# Patient Record
Sex: Male | Born: 1950 | Race: White | Hispanic: No | Marital: Married | Smoking: Never smoker
Health system: Southern US, Community
[De-identification: ages and names within clinical notes are randomized; demographics above are authoritative.]

## PROBLEM LIST (undated history)

## (undated) DIAGNOSIS — I2510 Atherosclerotic heart disease of native coronary artery without angina pectoris: Secondary | ICD-10-CM

## (undated) DIAGNOSIS — E785 Hyperlipidemia, unspecified: Secondary | ICD-10-CM

## (undated) DIAGNOSIS — I214 Non-ST elevation (NSTEMI) myocardial infarction: Secondary | ICD-10-CM

## (undated) DIAGNOSIS — E7800 Pure hypercholesterolemia, unspecified: Secondary | ICD-10-CM

## (undated) DIAGNOSIS — K5790 Diverticulosis of intestine, part unspecified, without perforation or abscess without bleeding: Secondary | ICD-10-CM

## (undated) DIAGNOSIS — R001 Bradycardia, unspecified: Secondary | ICD-10-CM

## (undated) DIAGNOSIS — I10 Essential (primary) hypertension: Secondary | ICD-10-CM

## (undated) DIAGNOSIS — Z9861 Coronary angioplasty status: Secondary | ICD-10-CM

## (undated) DIAGNOSIS — M1990 Unspecified osteoarthritis, unspecified site: Secondary | ICD-10-CM

## (undated) DIAGNOSIS — T50905A Adverse effect of unspecified drugs, medicaments and biological substances, initial encounter: Secondary | ICD-10-CM

## (undated) DIAGNOSIS — I209 Angina pectoris, unspecified: Secondary | ICD-10-CM

## (undated) HISTORY — PX: MELANOMA EXCISION: SHX5266

## (undated) HISTORY — DX: Diverticulosis of intestine, part unspecified, without perforation or abscess without bleeding: K57.90

## (undated) SURGERY — LEFT HEART CATH
Anesthesia: Moderate Sedation | Laterality: Left

---
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
## 1972-11-27 HISTORY — PX: WISDOM TOOTH EXTRACTION: SHX21

## 2004-09-23 ENCOUNTER — Encounter: Admission: RE | Admit: 2004-09-23 | Discharge: 2004-09-23 | Payer: Self-pay | Admitting: Internal Medicine

## 2005-05-05 ENCOUNTER — Ambulatory Visit: Payer: Self-pay | Admitting: Gastroenterology

## 2005-05-19 ENCOUNTER — Ambulatory Visit: Payer: Self-pay | Admitting: Internal Medicine

## 2011-11-30 ENCOUNTER — Emergency Department (HOSPITAL_COMMUNITY): Payer: Managed Care, Other (non HMO)

## 2011-11-30 ENCOUNTER — Encounter (HOSPITAL_COMMUNITY)
Admission: EM | Disposition: A | Discharge status: Home / Self Care | Payer: Self-pay | Source: Home / Self Care | Attending: Emergency Medicine

## 2011-11-30 ENCOUNTER — Other Ambulatory Visit: Payer: Self-pay

## 2011-11-30 ENCOUNTER — Encounter: Payer: Self-pay | Admitting: Emergency Medicine

## 2011-11-30 ENCOUNTER — Observation Stay (HOSPITAL_COMMUNITY)
Admission: EM | Admit: 2011-11-30 | Discharge: 2011-12-01 | DRG: 251 | Disposition: A | Discharge status: Home / Self Care | Payer: Managed Care, Other (non HMO) | Attending: Internal Medicine | Admitting: Internal Medicine

## 2011-11-30 DIAGNOSIS — I9589 Other hypotension: Secondary | ICD-10-CM | POA: Diagnosis not present

## 2011-11-30 DIAGNOSIS — T50905A Adverse effect of unspecified drugs, medicaments and biological substances, initial encounter: Secondary | ICD-10-CM

## 2011-11-30 DIAGNOSIS — I498 Other specified cardiac arrhythmias: Secondary | ICD-10-CM | POA: Diagnosis not present

## 2011-11-30 DIAGNOSIS — Y9210 Unspecified residential institution as the place of occurrence of the external cause: Secondary | ICD-10-CM | POA: Diagnosis not present

## 2011-11-30 DIAGNOSIS — Z7982 Long term (current) use of aspirin: Secondary | ICD-10-CM

## 2011-11-30 DIAGNOSIS — E785 Hyperlipidemia, unspecified: Secondary | ICD-10-CM

## 2011-11-30 DIAGNOSIS — I200 Unstable angina: Secondary | ICD-10-CM | POA: Diagnosis present

## 2011-11-30 DIAGNOSIS — I10 Essential (primary) hypertension: Secondary | ICD-10-CM

## 2011-11-30 DIAGNOSIS — Z7902 Long term (current) use of antithrombotics/antiplatelets: Secondary | ICD-10-CM

## 2011-11-30 DIAGNOSIS — Z79899 Other long term (current) drug therapy: Secondary | ICD-10-CM

## 2011-11-30 DIAGNOSIS — T50995A Adverse effect of other drugs, medicaments and biological substances, initial encounter: Secondary | ICD-10-CM | POA: Diagnosis not present

## 2011-11-30 DIAGNOSIS — I2510 Atherosclerotic heart disease of native coronary artery without angina pectoris: Secondary | ICD-10-CM | POA: Diagnosis present

## 2011-11-30 DIAGNOSIS — E7800 Pure hypercholesterolemia, unspecified: Secondary | ICD-10-CM | POA: Diagnosis present

## 2011-11-30 DIAGNOSIS — Z9861 Coronary angioplasty status: Secondary | ICD-10-CM

## 2011-11-30 DIAGNOSIS — I214 Non-ST elevation (NSTEMI) myocardial infarction: Principal | ICD-10-CM

## 2011-11-30 DIAGNOSIS — R001 Bradycardia, unspecified: Secondary | ICD-10-CM

## 2011-11-30 HISTORY — DX: Essential (primary) hypertension: I10

## 2011-11-30 HISTORY — DX: Bradycardia, unspecified: R00.1

## 2011-11-30 HISTORY — DX: Coronary angioplasty status: Z98.61

## 2011-11-30 HISTORY — PX: LEFT HEART CATHETERIZATION WITH CORONARY ANGIOGRAM: SHX5451

## 2011-11-30 HISTORY — DX: Non-ST elevation (NSTEMI) myocardial infarction: I21.4

## 2011-11-30 HISTORY — DX: Angina pectoris, unspecified: I20.9

## 2011-11-30 HISTORY — DX: Hyperlipidemia, unspecified: E78.5

## 2011-11-30 HISTORY — PX: CARDIAC CATHETERIZATION: SHX172

## 2011-11-30 HISTORY — PX: PERCUTANEOUS CORONARY INTERVENTION-BALLOON ONLY: SHX6014

## 2011-11-30 HISTORY — DX: Unspecified osteoarthritis, unspecified site: M19.90

## 2011-11-30 HISTORY — DX: Pure hypercholesterolemia, unspecified: E78.00

## 2011-11-30 HISTORY — DX: Adverse effect of unspecified drugs, medicaments and biological substances, initial encounter: T50.905A

## 2011-11-30 HISTORY — DX: Atherosclerotic heart disease of native coronary artery without angina pectoris: I25.10

## 2011-11-30 LAB — BASIC METABOLIC PANEL
BUN: 18 mg/dL (ref 6–23)
CO2: 29 mEq/L (ref 19–32)
Calcium: 9.6 mg/dL (ref 8.4–10.5)
Chloride: 102 mEq/L (ref 96–112)
Creatinine, Ser: 1.19 mg/dL (ref 0.50–1.35)
GFR calc Af Amer: 76 mL/min — ABNORMAL LOW (ref >90)
GFR calc non Af Amer: 65 mL/min — ABNORMAL LOW (ref >90)
Glucose, Bld: 94 mg/dL (ref 70–99)
Potassium: 5 mEq/L (ref 3.5–5.1)
Sodium: 138 mEq/L (ref 135–145)

## 2011-11-30 LAB — CBC
HCT: 41.3 % (ref 39.0–52.0)
Hemoglobin: 14.2 g/dL (ref 13.0–17.0)
MCH: 32.1 pg (ref 26.0–34.0)
MCHC: 34.4 g/dL (ref 30.0–36.0)
MCV: 93.2 fL (ref 78.0–100.0)
Platelets: 205 10*3/uL (ref 150–400)
RBC: 4.43 MIL/uL (ref 4.22–5.81)
RDW: 12.1 % (ref 11.5–15.5)
WBC: 5.6 10*3/uL (ref 4.0–10.5)

## 2011-11-30 LAB — CARDIAC PANEL(CRET KIN+CKTOT+MB+TROPI)
CK, MB: 3.5 ng/mL (ref 0.3–4.0)
Relative Index: 3.1 — ABNORMAL HIGH (ref 0.0–2.5)
Total CK: 113 U/L (ref 7–232)
Troponin I: 0.34 ng/mL (ref <0.30)

## 2011-11-30 LAB — D-DIMER, QUANTITATIVE: D-Dimer, Quant: 0.28 ug/mL-FEU (ref 0.00–0.48)

## 2011-11-30 LAB — POCT I-STAT TROPONIN I: Troponin i, poc: 0.01 ng/mL (ref 0.00–0.08)

## 2011-11-30 SURGERY — LEFT HEART CATHETERIZATION WITH CORONARY ANGIOGRAM
Anesthesia: LOCAL

## 2011-11-30 SURGERY — LEFT HEART CATHETERIZATION WITH CORONARY ANGIOGRAM
Anesthesia: Moderate Sedation | Laterality: Right

## 2011-11-30 MED ORDER — LIDOCAINE HCL (PF) 1 % IJ SOLN
INTRAMUSCULAR | Status: AC
  Filled 2011-11-30: qty 30

## 2011-11-30 MED ORDER — NITROGLYCERIN 0.4 MG SL SUBL: 0.4000 mg | SUBLINGUAL_TABLET | SUBLINGUAL | Status: DC | PRN

## 2011-11-30 MED ORDER — NITROGLYCERIN 0.4 MG SL SUBL
0.4000 mg | SUBLINGUAL_TABLET | SUBLINGUAL | Status: DC | PRN
  Administered 2011-11-30: 0.4 mg via SUBLINGUAL

## 2011-11-30 MED ORDER — ASPIRIN 81 MG PO CHEW
81.0000 mg | CHEWABLE_TABLET | Freq: Every day | ORAL | Status: DC
  Administered 2011-12-01: 81 mg via ORAL
  Filled 2011-11-30: qty 1

## 2011-11-30 MED ORDER — SODIUM CHLORIDE 0.9 % IJ SOLN: 3.0000 mL | INTRAMUSCULAR | Status: DC | PRN

## 2011-11-30 MED ORDER — LISINOPRIL 20 MG PO TABS
20.0000 mg | ORAL_TABLET | Freq: Every day | ORAL | Status: DC
  Administered 2011-11-30 – 2011-12-01 (×2): 20 mg via ORAL
  Filled 2011-11-30 (×2): qty 1

## 2011-11-30 MED ORDER — THERA M PLUS PO TABS
1.0000 | ORAL_TABLET | Freq: Every day | ORAL | Status: DC
  Administered 2011-11-30 – 2011-12-01 (×2): 1 via ORAL
  Filled 2011-11-30 (×2): qty 1

## 2011-11-30 MED ORDER — ACETAMINOPHEN 325 MG PO TABS
650.0000 mg | ORAL_TABLET | ORAL | Status: DC | PRN
  Administered 2011-11-30: 650 mg via ORAL
  Filled 2011-11-30: qty 2

## 2011-11-30 MED ORDER — SODIUM CHLORIDE 0.9 % IV SOLN
INTRAVENOUS | Status: DC
  Administered 2011-11-30: 08:00:00 via INTRAVENOUS

## 2011-11-30 MED ORDER — ONDANSETRON HCL 4 MG/2ML IJ SOLN: 4.0000 mg | Freq: Four times a day (QID) | INTRAMUSCULAR | Status: DC | PRN

## 2011-11-30 MED ORDER — TICAGRELOR 90 MG PO TABS
90.0000 mg | ORAL_TABLET | Freq: Two times a day (BID) | ORAL | Status: DC
  Administered 2011-12-01: 90 mg via ORAL
  Filled 2011-11-30 (×2): qty 1

## 2011-11-30 MED ORDER — ASPIRIN EC 81 MG PO TBEC: 81.0000 mg | DELAYED_RELEASE_TABLET | Freq: Every day | ORAL | Status: DC

## 2011-11-30 MED ORDER — SODIUM CHLORIDE 0.9 % IV SOLN: 250.0000 mL | INTRAVENOUS | Status: DC | PRN

## 2011-11-30 MED ORDER — VERAPAMIL HCL 2.5 MG/ML IV SOLN
INTRAVENOUS | Status: AC
  Filled 2011-11-30: qty 2

## 2011-11-30 MED ORDER — ASPIRIN 300 MG RE SUPP
300.0000 mg | RECTAL | Status: DC
  Filled 2011-11-30: qty 1

## 2011-11-30 MED ORDER — ZOLPIDEM TARTRATE 5 MG PO TABS: 10.0000 mg | ORAL_TABLET | Freq: Every evening | ORAL | Status: DC | PRN

## 2011-11-30 MED ORDER — FENTANYL CITRATE 0.05 MG/ML IJ SOLN
INTRAMUSCULAR | Status: AC
  Filled 2011-11-30: qty 2

## 2011-11-30 MED ORDER — ASPIRIN 81 MG PO CHEW
324.0000 mg | CHEWABLE_TABLET | Freq: Once | ORAL | Status: AC
  Administered 2011-11-30: 324 mg via ORAL

## 2011-11-30 MED ORDER — RED YEAST RICE 600 MG PO CAPS: ORAL_CAPSULE | Freq: Every day | ORAL | Status: DC

## 2011-11-30 MED ORDER — TICAGRELOR 90 MG PO TABS
ORAL_TABLET | ORAL | Status: AC
  Administered 2011-12-01: 90 mg via ORAL
  Filled 2011-11-30: qty 2

## 2011-11-30 MED ORDER — SODIUM CHLORIDE 0.9 % IV SOLN
INTRAVENOUS | Status: DC
Start: 2011-11-30 — End: 2011-11-30

## 2011-11-30 MED ORDER — SODIUM CHLORIDE 0.9 % IV SOLN: INTRAVENOUS | Status: AC

## 2011-11-30 MED ORDER — HEPARIN (PORCINE) IN NACL 2-0.9 UNIT/ML-% IJ SOLN
INTRAMUSCULAR | Status: AC
  Filled 2011-11-30: qty 2000

## 2011-11-30 MED ORDER — ACETAMINOPHEN 325 MG PO TABS: 650.0000 mg | ORAL_TABLET | ORAL | Status: DC | PRN

## 2011-11-30 MED ORDER — HEPARIN SODIUM (PORCINE) 1000 UNIT/ML IJ SOLN
INTRAMUSCULAR | Status: AC
  Filled 2011-11-30: qty 1

## 2011-11-30 MED ORDER — ASPIRIN 81 MG PO CHEW
CHEWABLE_TABLET | ORAL | Status: AC
  Administered 2011-11-30: 324 mg via ORAL
  Filled 2011-11-30: qty 4

## 2011-11-30 MED ORDER — SODIUM CHLORIDE 0.9 % IJ SOLN
3.0000 mL | Freq: Two times a day (BID) | INTRAMUSCULAR | Status: DC
  Administered 2011-11-30 (×2): 3 mL via INTRAVENOUS

## 2011-11-30 MED ORDER — ASPIRIN 81 MG PO CHEW
324.0000 mg | CHEWABLE_TABLET | ORAL | Status: DC
  Administered 2011-11-30: 324 mg via ORAL

## 2011-11-30 MED ORDER — NITROGLYCERIN 0.4 MG SL SUBL
SUBLINGUAL_TABLET | SUBLINGUAL | Status: AC
  Administered 2011-11-30: 05:00:00
  Filled 2011-11-30: qty 25

## 2011-11-30 MED ORDER — BIVALIRUDIN 250 MG IV SOLR
INTRAVENOUS | Status: AC
  Filled 2011-11-30: qty 250

## 2011-11-30 MED ORDER — CO Q 10 10 MG PO CAPS: ORAL_CAPSULE | Freq: Every day | ORAL | Status: DC

## 2011-11-30 MED ORDER — MIDAZOLAM HCL 2 MG/2ML IJ SOLN
INTRAMUSCULAR | Status: AC
  Filled 2011-11-30: qty 2

## 2011-11-30 MED ORDER — MORPHINE SULFATE 2 MG/ML IJ SOLN: 2.0000 mg | INTRAMUSCULAR | Status: DC | PRN

## 2011-11-30 NOTE — Procedures (Signed)
  CARDIAC CATHETERIZATION REPORT   Procedures performed:  1. Left heart catheterization  2. Selective coronary angiography    Reason for procedure: Unstable angina pectoris  Procedure performed by: Thurmon Fair, MD, [facility]  Complications: none   Estimated blood loss: less than 5 mL   History:  Presentation with prolonged (new-onset) angina at rest responsive to NTG, no ECG changes and negative point of care cardiac enzymes.  Consent: The risks, benefits, and details of the procedure were explained to the patient. Risks including death, MI, stroke, bleeding, limb ischemia, renal failure and allergy were described and accepted by the patient. Informed written consent was obtained prior to proceeding.  Technique: The patient was brought to the cardiac catheterization laboratory in the fasting state. He was prepped and draped in the usual sterile fashion. Local anesthesia with 1% lidocaine was administered to the right wrist area. Using the modified Seldinger technique a 5 French right radial artery Glide sheath was introduced without difficulty. Under fluoroscopic guidance, using 5 [place] TIG and JR catheters, selective cannulation of the left coronary artery, right coronary artery and left ventricle were respectively performed. Several coronary angiograms in a variety of projections were recorded. Left ventricular pressure and a pull back to the aorta were recorded. No immediate complications occurred. The diagnostic procedure is to be followed by PCI by Dr. Bryan Lemma.  Contrast used: 70 mL Omnipaque  Angiographic Findings:  1. The left main coronary artery is free of significant atherosclerosis and bifurcates in the usual fashion into the left anterior descending artery and left circumflex coronary artery.  2. The left anterior descending artery is a large vessel that reaches the apex and generates  Two major diagonal branches. There is evidence of minimal luminal irregularities and no  calcification. No hemodynamically meaningful stenoses are seen. 3. The left circumflex coronary artery is a very large-size vessel but non dominant vessel that generates five major oblque marginal arteries, of which the second is by far the largest. There is evidence of mild diffuse luminal irregularities and no calcification. The only hemodynamically meaningful stenosis is a 85-90% irregular lesion seen immediately downstream of the large second OM, upstream of the last three OM/PLV branches. 4. The right coronary artery is a medium-size dominant vessel that generates a fairly short posterior lateral ventricular system as well as the PDA. There is evidence of moderate luminal irregularities and no calcification. A 30-40% smooth tubular lesion is seen in the mid vessel, followed by a slightly ectatic area.  5. The left ventricle was catheterized, but not injected. There is no aortic valve stenosis by pullback. The left ventricular end-diastolic pressure is 12 mm Hg.   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   RECOMMENDATION:  Percutaneous revascularization of the left circumflex coronary artery.

## 2011-11-30 NOTE — H&P (Signed)
[facility]    ADMISSION HISTORY & PHYSICAL   Chief Complaint:  Chest pressure that awakened him from sleep.  HPI:  This is a 61 y.o. male patient of Dr. Eric Form, who has never seen a cardiologist, with a past medical history significant for hypertension, dyslipidemia and intolerance to statins, and family history of premature coronary disease with his father having an MI at age 51. He had an episode of chest pressure which awakened him from sleep at approximately 3 AM this morning. He said he has never had any symptoms like this before and is clearly different than any reflux symptoms which he has very infrequently. He has no history of sleep apnea or concerning features for this. He's chest pressure persisted for about 15 minutes at which time he felt that he should go to the emergency room. Upon arrival he was given to not sublingual nitroglycerin, and subsequently he developed hypotension and bradycardia with heart rates in the 30s. This responded to to 1 L normal saline fluid boluses. His chest pressure did resolve with nitroglycerin. Currently the heart rate is in the 60s with blood pressures in the 90s which is lower than typical for him. EKG shows no acute ischemic changes. The first set of cardiac markers are negative.   PMHx:  Past Medical History  Diagnosis Date  . Hypertension   . Hypercholesteremia     Past Surgical History  Procedure Date  . Appendectomy   . Tonsillectomy     FAMHx:  History reviewed. No pertinent family history.  SOCHx:   reports that he has never smoked. He does not have any smokeless tobacco history on file. He reports that he does not drink alcohol or use illicit drugs.  ALLERGIES:  No Known Allergies  ROS: A comprehensive review of systems was negative except for: Cardiovascular: positive for chest pressure/discomfort  HOME MEDS: Medications Prior to Admission  Medication Dose Route Frequency Provider Last Rate  Last Dose  . aspirin chewable tablet 324 mg  324 mg Oral Once Sunnie Nielsen, MD   324 mg at 11/30/11 0451  . nitroGLYCERIN (NITROSTAT) 0.4 MG SL tablet           . DISCONTD: nitroGLYCERIN (NITROSTAT) SL tablet 0.4 mg  0.4 mg Sublingual Q5 Min x 3 PRN Sunnie Nielsen, MD   0.4 mg at 11/30/11 0457   No current outpatient prescriptions on file as of 11/30/2011.    LABS/IMAGING: Results for orders placed during the hospital encounter of 11/30/11 (from the past 48 hour(s))  CBC     Status: Normal   Collection Time   11/30/11  3:52 AM      Component Value Range Comment   WBC 5.6  4.0 - 10.5 (K/uL)    RBC 4.43  4.22 - 5.81 (MIL/uL)    Hemoglobin 14.2  13.0 - 17.0 (g/dL)    HCT 16.1  09.6 - 04.5 (%)    MCV 93.2  78.0 - 100.0 (fL)    MCH 32.1  26.0 - 34.0 (pg)    MCHC 34.4  30.0 - 36.0 (g/dL)    RDW 40.9  81.1 - 91.4 (%)    Platelets 205  150 - 400 (K/uL)   BASIC METABOLIC PANEL     Status: Abnormal   Collection Time   11/30/11  3:52 AM      Component Value Range Comment   Sodium 138  135 - 145 (mEq/L)    Potassium 5.0  3.5 -  5.1 (mEq/L) HEMOLYSIS AT THIS LEVEL MAY AFFECT RESULT   Chloride 102  96 - 112 (mEq/L)    CO2 29  19 - 32 (mEq/L)    Glucose, Bld 94  70 - 99 (mg/dL)    BUN 18  6 - 23 (mg/dL)    Creatinine, Ser 9.14  0.50 - 1.35 (mg/dL)    Calcium 9.6  8.4 - 10.5 (mg/dL)    GFR calc non Af Amer 65 (*) >90 (mL/min)    GFR calc Af Amer 76 (*) >90 (mL/min)   POCT I-STAT TROPONIN I     Status: Normal   Collection Time   11/30/11  4:07 AM      Component Value Range Comment   Troponin i, poc 0.01  0.00 - 0.08 (ng/mL)    Comment 3            D-DIMER, QUANTITATIVE     Status: Normal   Collection Time   11/30/11  5:05 AM      Component Value Range Comment   D-Dimer, Quant 0.28  0.00 - 0.48 (ug/mL-FEU)    Dg Chest Port 1 View  11/30/2011  *RADIOLOGY REPORT*  Clinical Data: Chest pain  PORTABLE CHEST - 1 VIEW  Comparison: None.  Findings: Normal heart size and pulmonary vascularity for technique.   No focal airspace consolidation in the lungs.  The costophrenic angles are not included on the film.  No pneumothorax.  IMPRESSION: No evidence of active pulmonary disease.  Original Report Authenticated By: Marlon Pel, M.D.    VITALS: Blood pressure 97/64, pulse 55, temperature 97.6 F (36.4 C), temperature source Oral, resp. rate 18, SpO2 100.00%.  EXAM: General appearance: alert and no distress Neck: no adenopathy, no carotid bruit, no JVD, supple, symmetrical, trachea midline and thyroid not enlarged, symmetric, no tenderness/mass/nodules Lungs: clear to auscultation bilaterally Heart: regular rate and rhythm, S1, S2 normal, no murmur, click, rub or gallop Abdomen: soft, non-tender; bowel sounds normal; no masses,  no organomegaly Extremities: extremities normal, atraumatic, no cyanosis or edema Pulses: 2+ and symmetric Skin: Skin color, texture, turgor normal. No rashes or lesions Neurologic: Grossly normal  IMPRESSION: 1. Chest pressure, concerning for unstable angina. 2. Bradycardia and hypotension in the setting of nitrates. 3. History of hypertension 4. Dyslipidemia, intolerant to statin  PLAN: 1. Calvin Patterson had chest pressure which is concerning for typical angina. There no current EKG changes and first cardiac markers are negative, however he is response to nitrates, bradycardia, hypotension and chest pressure concerning for possible proximal RCA stenosis. I discussed possible diagnostic approaches with Calvin Patterson and feel that cardiac catheterization is warranted. After discussing the risks and benefits of the procedure Calvin Patterson agrees to proceed with cardiac catheterization. He is n.p.o. will plan on catheterization today with Dr. Herbie Baltimore.  Chrystie Nose, MD Attending Cardiologist [facility]  Diego Ulbricht C 11/30/2011, 7:31 AM

## 2011-11-30 NOTE — Progress Notes (Signed)
Troponin 0.34, ckmb3.5 results relayed to Nada Boozer. No new orders given.

## 2011-11-30 NOTE — ED Notes (Signed)
PT. WOKE UP THIS MORNING WITH MID STERNAL CHEST PAIN , DENIES SOB , NO COUGH ,  NO NAUSEA OR VOMITTING , DENIES DIAPHORESIS.

## 2011-11-30 NOTE — Op Note (Signed)
[facility]  PERCUTANEOUS CORONARY INTERVENTION PROCEDURE  NAME:  Calvin Patterson   MRN: [id number] DOB:  1951-02-19   ADMIT DATE: 11/30/2011  Surgeon(s):  Thurmon Fair, MD --Diagnostic Catheterization Marykay Lex, MD,MS -- Interventional Procedure   Procedures performed:  Successful Cutting Balloon Atherectomy/Angioplasty of the AV groove circumflex reducing to less than 20% stenosis  Intracoronary Nitroglycerin injection x2  PATIENT: Calvin Patterson 61 y.o. male with a history of HTN, HLD & FH of CAD (father @ 79) who awoke @ 3Am with sudden onset of substernal CP c/w unstable angina lasting ~69min.  Notably, he became hypotensive & bradycardic after NTG SL. No ECG changes.  He underwent diagnostic coronary angiography by Dr. Royann Shivers demonstrating a ~90% ostial AV Groove Circumflex at the bifurcation with a large (major) OM2 branch. He is now referred for PTCA of this lesion.  Appropriate consent for ad-hoc PCI had been obtained prior to diagnostic catheterization.  Procedure The 5Fr Sheath was exchanged over a wire for a 6Fr sheath.   A weight based bolus of IV Angiomax was administered and the drip was continued until completion of the procedure.   Oral Ticagrelor 180 mg was administered.  The Guide catheter(s) were advanced over a J-wire and used to engage the left Coronary Artery. An ACT of > 200 Sec was confirmed prior to advancing the Guidewire.  Lesion #1:  AV Groove Circumflex, ostium at Major OM 2  bifurcation  Pre-PCI Stenosis: 90 % Post-PCI Stenosis: Less than 20 %     TIMI 3 flow       TIMI 3 flow  Guide Catheter: 6 French XB 3    Guidewire: BMW  Pre-Dilitation Balloon: Flextome Cutting balloon 2.0 mm x 6 mm   1st Inflation:  4 Atm for 30 Sec   2nd Inflation: 6 Atm for 48 Sec   3rd Inflation: 6 Atm for 45 Sec   4th inflation:  8 Atm for 45 Sec Scout angiography did not reveal evidence of dissection or perforation  Balloon #2:  Flexetome Cutting Balloon 2.25 mm x 6 mm   Deployment:  2 Atm for 32 Sec   2nd Inflation: 8 Atm for 115 Sec   IC nitroglycerin 200 mcg x1 Scout angiography did not reveal evidence of dissection or perforation, but after waiting 5 minutes there did appear to be some recoil therefore decision is to proceed further dilatation  Final Balloon:  Flexetome Cutting Balloon 2.25 mm x 6 mm   1st Inflation:  4 Atm for 90 Sec     Final angiography with and without wire in place revealed minimal recoil, no dissection or perforation with TIMI 3 flow restored.  The guide was removed the body over wire. The sheath was removed with a TR band placed at 1041 hours with 11 Atm air demonstrating nonocclusive hemostasis.  The patient stable before during and after procedure and tolerated procedure well without any complications.  PATIENT DISPOSITION: PACU - hemodynamically stable.   PLAN OF CARE: Admit for overnight observation  Dual Antiplatelet x at least 30 days, but with unstable angina as presentation - consider x 1 yr.  Consider Statin (unless intolerant)  Continue Home HTN medications  Anticipate d/c home tomorrow.   The procedure was discussed with the patient and family.  Marykay Lex, M.D., M.S. [facility] [address] [place], [place]  [address]  [phone]  11/30/2011 12:18 PM

## 2011-11-30 NOTE — ED Notes (Signed)
Report called to Carollee Herter, RN in cath lab.  Maxine Glenn, RN transporting pt.  Pt still denies chest pain.

## 2011-11-30 NOTE — ED Provider Notes (Signed)
History     CSN: [id number]  Arrival date & time 11/30/11  0346   First MD Initiated Contact with Patient 11/30/11 0532      Chief Complaint  Patient presents with  . Chest Pain    (Consider location/radiation/quality/duration/timing/severity/associated sxs/prior treatment) The history is provided by the patient.   woke up this morning with substernal chest pain and pressure. No shortness of breath, diaphoresis or nausea. Pain persisted and patient presents here for evaluation. He has no history of heart problems. He is treated for hypertension and high cholesterol. He is not a smoker. Pain is not radiating. No aggravating or alleviating factors. Moderate in severity. No history of same  Past Medical History  Diagnosis Date  . Hypertension   . Hypercholesteremia     Past Surgical History  Procedure Date  . Appendectomy   . Tonsillectomy     History reviewed. No pertinent family history.  History  Substance Use Topics  . Smoking status: Never Smoker   . Smokeless tobacco: Not on file  . Alcohol Use: No      Review of Systems  Constitutional: Negative for fever and chills.  HENT: Negative for neck pain and neck stiffness.   Eyes: Negative for pain.  Respiratory: Negative for shortness of breath.   Cardiovascular: Positive for chest pain.  Gastrointestinal: Negative for abdominal pain.  Genitourinary: Negative for dysuria.  Musculoskeletal: Negative for back pain.  Skin: Negative for rash.  Neurological: Negative for headaches.  All other systems reviewed and are negative.    Allergies  Review of patient's allergies indicates no known allergies.  Home Medications   Current Outpatient Rx  Name Route Sig Dispense Refill  . CO Q 10 PO Oral Take 1 tablet by mouth daily. Hold while in hospital     . LISINOPRIL 20 MG PO TABS Oral Take 20 mg by mouth daily.      Carma Leaven M PLUS PO TABS Oral Take 1 tablet by mouth daily.      . RED YEAST RICE PO Oral Take 1 tablet  by mouth daily. Hold while in hospital       BP 79/50  Pulse 55  Temp(Src) 97.6 F (36.4 C) (Oral)  Resp 19  SpO2 100%  Physical Exam  Constitutional: He is oriented to person, place, and time. He appears well-developed and well-nourished.  HENT:  Head: Normocephalic and atraumatic.  Eyes: Conjunctivae and EOM are normal. Pupils are equal, round, and reactive to light.  Neck: Trachea normal. Neck supple. No thyromegaly present.  Cardiovascular: Normal rate, regular rhythm, S1 normal, S2 normal and normal pulses.     No systolic murmur is present   No diastolic murmur is present  Pulses:      Radial pulses are 2+ on the right side, and 2+ on the left side.  Pulmonary/Chest: Effort normal and breath sounds normal. He has no wheezes. He has no rhonchi. He has no rales. He exhibits no tenderness.  Abdominal: Soft. Normal appearance and bowel sounds are normal. There is no tenderness. There is no CVA tenderness and negative Murphy's sign.  Musculoskeletal:       BLE:s Calves nontender, no cords or erythema, negative Homans sign  Neurological: He is alert and oriented to person, place, and time. He has normal strength. No cranial nerve deficit or sensory deficit. GCS eye subscore is 4. GCS verbal subscore is 5. GCS motor subscore is 6.  Skin: Skin is warm and dry. No rash noted. He  is not diaphoretic.  Psychiatric: His speech is normal.       Cooperative and appropriate    ED Course  Procedures (including critical care time)  Labs Reviewed  BASIC METABOLIC PANEL - Abnormal; Notable for the following:    GFR calc non Af Amer 65 (*)    GFR calc Af Amer 76 (*)    All other components within normal limits  CBC  POCT I-STAT TROPONIN I  I-STAT TROPONIN I  D-DIMER, QUANTITATIVE   Dg Chest Port 1 View  11/30/2011  *RADIOLOGY REPORT*  Clinical Data: Chest pain  PORTABLE CHEST - 1 VIEW  Comparison: None.  Findings: Normal heart size and pulmonary vascularity for technique.  No focal  airspace consolidation in the lungs.  The costophrenic angles are not included on the film.  No pneumothorax.  IMPRESSION: No evidence of active pulmonary disease.  Original Report Authenticated By: Marlon Pel, M.D.    Date: 11/30/2011  Rate: 55  Rhythm: sinus bradycardia  QRS Axis: normal  Intervals: normal  ST/T Wave abnormalities: nonspecific ST changes  Conduction Disutrbances:none  Narrative Interpretation:   Old EKG Reviewed: none available  Patient had a hypotensive episode after nitroglycerin. His pain resolved with medication but he became bradycardic and his blood pressure dropped into the 70s. After IV fluids blood pressure improving.  5:38 AM Case discussed with [facility] on call attending and recommends cardiology consult and admit.  Case discussed as above with Dr. Rennis Golden at 6 AM. He is on call for [facility] heart and vascular. He agrees to emergency department evaluation.   MDM   Chest pain lowers for ACS but concern for the same. Cardiology consultation in the ED obtained        Sunnie Nielsen, MD 11/30/11 [phone]

## 2011-11-30 NOTE — Brief Op Note (Addendum)
11/30/2011  10:58 AM  PATIENT:  Calvin Patterson  61 y.o. male with a history of HTN, HLD & FH of CAD (father @ 18) who awoke @ 3Am with sudden onset of substernal CP c/w unstable angina lasting ~75min.   Notably, he became hypotensive & bradycardic after NTG SL.  No ECG changes. He underwent diagnostic coronary angiography by Dr. Royann Shivers demonstrating a ~90% ostial AV Groove Circumflex at the bifurcation with a large (major) OM2 branch.  He is now referred for PTCA of this lesion. Appropriate consent for ad-hoc PCI had been obtained prior to diagnostic catheterization.  PRE-OPERATIVE DIAGNOSIS:    Unstable Angina  90% ostial AVG Circumflex(@ OM 2) lesion  POST-OPERATIVE DIAGNOSIS:    Successful cutting Balloon Atherectomy/ Angioplasty of the 90% ostial AVG Circumflex (@ OM 2) lesion -- reducing the lesion to <20%  PROCEDURE:  Procedure(s):  PERCUTANEOUS CORONARY INTERVENTION-BALLOON ONLY  SURGEON:  Surgeon(s): Thurmon Fair, MD Marykay Lex -- Interventional Procedure  ANESTHESIA:   IV sedation; 2 mg Versed, 50 mcg Fentanyl MEDICATIONS:   BRILINTA 90MG  PO  ANGIOMAX (0.75 mg/kg) and infusion (1.75mg /kg/hr) during procedure (ACT>200 achieved) Radial Cocktail: 5 mg Verapamil, 400 mcg NTG, 2 ml 2% Lidocaine Omnipaque Contrast:  EBL:   < 10 ml  LOCAL MEDICATIONS USED:  NONE  5Fr to 6 Fr sheat --> Radial cocktail & Angiomax (ACT>200)  Guide: 6Fr XB 3.5 ; Wire: BMW  Flextome Cutting Balloon 2.0 mm x 6 mm: 4 ATM x 30 Sec, 6 ATM x 48 Sec, 6 ATM  x 45 Sec, 8 ATM  x 45 Sec  Flextome Cutting Balloon 2.25 mm x 6 mm: 2 ATM x 32 Sec, 8 ATM x 115Sec  Flextome Cutting Balloon 2.5 mm x 6 mm: 4 ATM x 90 Sec -- Final diameter 2.36mm  TOURNIQUET:  TR Band - 11 ml air, 1041 hrs  DICTATION: .Note written in EPIC  PLAN OF CARE: Admit for overnight observation  Dual Antiplatelet x at least 30 days, but with unstable angina as presentation - consider x 1 yr.  Consider Statin  (unless intolerant)  Continue Home HTN medications  PATIENT DISPOSITION:  PACU - hemodynamically stable. Anticipate d/c home tomorrow.   Delay start of Pharmacological VTE agent (>24hrs) due to surgical blood loss or risk of bleeding:  N/A  Marykay Lex, M.D., M.S. [facility] [address] [place], [place]  [address]  [phone]  11/30/2011 11:09 AM

## 2011-11-30 NOTE — ED Notes (Signed)
Called [facility]/Dr. Hilty @ [phone]

## 2011-12-01 ENCOUNTER — Other Ambulatory Visit: Payer: Self-pay

## 2011-12-01 LAB — BASIC METABOLIC PANEL
BUN: 13 mg/dL (ref 6–23)
Chloride: 106 mEq/L (ref 96–112)
Creatinine, Ser: 1.09 mg/dL (ref 0.50–1.35)
GFR calc Af Amer: 84 mL/min — ABNORMAL LOW (ref >90)
GFR calc non Af Amer: 72 mL/min — ABNORMAL LOW (ref >90)

## 2011-12-01 LAB — CBC
HCT: 37.7 % — ABNORMAL LOW (ref 39.0–52.0)
MCH: 31.4 pg (ref 26.0–34.0)
MCHC: 34 g/dL (ref 30.0–36.0)
MCV: 92.6 fL (ref 78.0–100.0)
RDW: 12.2 % (ref 11.5–15.5)

## 2011-12-01 LAB — CARDIAC PANEL(CRET KIN+CKTOT+MB+TROPI): Total CK: 106 U/L (ref 7–232)

## 2011-12-01 MED ORDER — ASPIRIN 81 MG PO TBEC: 162.0000 mg | DELAYED_RELEASE_TABLET | Freq: Every day | ORAL | Status: DC

## 2011-12-01 MED ORDER — TICAGRELOR 90 MG PO TABS: 90.0000 mg | ORAL_TABLET | Freq: Two times a day (BID) | ORAL | Status: DC

## 2011-12-01 MED ORDER — ROSUVASTATIN CALCIUM 5 MG PO TABS
5.0000 mg | ORAL_TABLET | ORAL | Status: DC
Start: 2011-12-01 — End: 2013-05-22

## 2011-12-01 MED ORDER — ASPIRIN 81 MG PO CHEW: 81.0000 mg | CHEWABLE_TABLET | Freq: Every day | ORAL | Status: AC

## 2011-12-01 MED FILL — Dextrose Inj 5%: INTRAVENOUS | Qty: 50 | Status: AC

## 2011-12-01 NOTE — Progress Notes (Signed)
CRITICAL VALUE ALERT  Critical value received:  Troponin 0.55  Date of notification:  12/01/11  Time of notification:  0245  Critical value read back:yes  Nurse who received alert:  Shelva Majestic    MD notified (1st page):  Wilburt Finlay, PA  Time of first page:  0246  MD notified (2nd page):  Time of second page:  Responding MD:  Wilburt Finlay, PA  Time MD responded:  [phone]

## 2011-12-01 NOTE — Progress Notes (Signed)
[facility]  Subjective:   Objective: Vital signs in last 24 hours: Temp:  [98.2 F (36.8 C)-99.5 F (37.5 C)] 99.5 F (37.5 C) (01/04 0848) Pulse Rate:  [66-85] 80  (01/04 0848) Resp:  [14-24] 24  (01/04 0848) BP: (99-124)/(48-76) 124/76 mmHg (01/04 0848) SpO2:  [97 %-99 %] 99 % (01/04 0848) Weight:  [79.8 kg (175 lb 14.8 oz)] 175 lb 14.8 oz (79.8 kg) (01/04 0603) Last BM Date: 11/30/11  Intake/Output from previous day: 01/03 0701 - 01/04 0700 In: 843 [P.O.:240; I.V.:603] Out: -  Intake/Output this shift: Total I/O In: 480 [P.O.:480] Out: -   Medications Current Facility-Administered Medications  Medication Dose Route Frequency Provider Last Rate Last Dose  . 0.9 %  sodium chloride infusion  250 mL Intravenous PRN Marykay Lex      . 0.9 %  sodium chloride infusion   Intravenous Continuous Mihai Croitoru, MD 100 mL/hr at 11/30/11 1109    . acetaminophen (TYLENOL) tablet 650 mg  650 mg Oral Q4H PRN Marykay Lex   650 mg at 11/30/11 2030  . aspirin chewable tablet 81 mg  81 mg Oral Daily Marykay Lex      . bivalirudin (ANGIOMAX) 250 MG injection           . lisinopril (PRINIVIL,ZESTRIL) tablet 20 mg  20 mg Oral Daily Marykay Lex   20 mg at 11/30/11 1442  . morphine 2 MG/ML injection 2 mg  2 mg Intravenous Q1H PRN Marykay Lex      . multivitamins ther. w/minerals tablet 1 tablet  1 tablet Oral Daily Marykay Lex   1 tablet at 11/30/11 1442  . ondansetron (ZOFRAN) injection 4 mg  4 mg Intravenous Q6H PRN Marykay Lex      . sodium chloride 0.9 % injection 3 mL  3 mL Intravenous Q12H Marykay Lex   3 mL at 11/30/11 2152  . sodium chloride 0.9 % injection 3 mL  3 mL Intravenous PRN Marykay Lex      . Ticagrelor (BRILINTA) 90 MG tablet           . Ticagrelor (BRILINTA) tablet 90 mg  90 mg Oral BID Marykay Lex      . verapamil (ISOPTIN) 2.5 MG/ML injection           . zolpidem (AMBIEN) tablet 10 mg  10 mg Oral QHS  PRN Marykay Lex      . DISCONTD: 0.9 %  sodium chloride infusion  250 mL Intravenous PRN Mihai Croitoru, MD      . DISCONTD: 0.9 %  sodium chloride infusion   Intravenous Continuous Mihai Croitoru, MD 100 mL/hr at 11/30/11 0800    . DISCONTD: 0.9 %  sodium chloride infusion   Intravenous Continuous Marykay Lex      . DISCONTD: acetaminophen (TYLENOL) tablet 650 mg  650 mg Oral Q4H PRN Mihai Croitoru, MD      . DISCONTD: aspirin chewable tablet 324 mg  324 mg Oral NOW Mihai Croitoru, MD   324 mg at 11/30/11 0930  . DISCONTD: aspirin EC tablet 81 mg  81 mg Oral Daily Mihai Croitoru, MD      . DISCONTD: aspirin suppository 300 mg  300 mg Rectal NOW Thurmon Fair, MD      . DISCONTD: Co Q 10 CAPS   Oral Daily Marykay Lex      . DISCONTD: nitroGLYCERIN (NITROSTAT) SL tablet  0.4 mg  0.4 mg Sublingual Q5 min PRN Mihai Croitoru, MD      . DISCONTD: ondansetron (ZOFRAN) injection 4 mg  4 mg Intravenous Q6H PRN Thurmon Fair, MD      . DISCONTD: Red Yeast Rice CAPS   Oral Daily Marykay Lex      . DISCONTD: sodium chloride 0.9 % injection 3 mL  3 mL Intravenous PRN Thurmon Fair, MD        PE: General Heart Lungs Extr.  Lab Results:   Basename 12/01/11 0418 11/30/11 0352  WBC 6.6 5.6  HGB 12.8* 14.2  HCT 37.7* 41.3  PLT 182 205   BMET  Basename 12/01/11 0418 11/30/11 0352  NA 139 138  K 4.4 5.0  CL 106 102  CO2 25 29  GLUCOSE 99 94  BUN 13 18  CREATININE 1.09 1.19  CALCIUM 9.2 9.6   PT/INR No results found for this basename: LABPROT:3,INR:3 in the last 72 hours Cholesterol No results found for this basename: CHOL in the last 72 hours Cardiac Enzymes No components found with this basename: TROPONIN:3, CKMB:3  Studies/Results: Left Heart Cath Lesion #1: AV Groove Circumflex, ostium at Major OM 2 bifurcation  Pre-PCI Stenosis: 90 % Post-PCI Stenosis: Less than 20 %  TIMI 3 flow TIMI 3 flow  Guide Catheter: 6 French XB 3 Guidewire: BMW  Pre-Dilitation  Balloon: Flextome Cutting balloon 2.0 mm x 6 mm  1st Inflation: 4 Atm for 30 Sec  2nd Inflation: 6 Atm for 48 Sec  3rd Inflation: 6 Atm for 45 Sec  4th inflation: 8 Atm for 45 Sec  Scout angiography did not reveal evidence of dissection or perforation  Balloon #2: Flexetome Cutting Balloon 2.25 mm x 6 mm  Deployment: 2 Atm for 32 Sec  2nd Inflation: 8 Atm for 115 Sec  IC nitroglycerin 200 mcg x1  Scout angiography did not reveal evidence of dissection or perforation, but after waiting 5 minutes there did appear to be some recoil therefore decision is to proceed further dilatation  Final Balloon: Flexetome Cutting Balloon 2.25 mm x 6 mm  1st Inflation: 4 Atm for 90 Sec  Final angiography with and without wire in place revealed minimal recoil, no dissection or perforation with TIMI 3 flow restored.  The guide was removed the body over wire.  The sheath was removed with a TR band placed at 1041 hours with 11 Atm air demonstrating nonocclusive hemostasis.  The patient stable before during and after procedure and tolerated procedure well without any complications.  PATIENT DISPOSITION: PACU - hemodynamically stable.   Assessment/Plan   Principal Problem:  *Unstable angina Active Problems:  NSTEMI (non-ST elevated myocardial infarction), 11/30/11  S/P percutaneous transluminal coronary angioplasty, with cutting baloon, av groove of LCX, 11/30/11  HTN (hypertension)  Dyslipidemia  Drug-induced bradycardia, and hypotension with NTG  Plan:  S/P Cutting balloon angioplasty.  AV Groove Circumflex, ostium at Major OM 2 bifurcation  Pre-PCI Stenosis: 90 % Post-PCI Stenosis: Less than 20 %.   LOS: 1 day    HAGER,BRYAN W 12/01/2011 9:14 AM  Agree with note written by Jones Skene PAC  S/P LCX cutting balloon atherectomy LCX by Dr. Herbie [place]. Enz neg. Right radial puncture site OK. D/C home on DAPT. ROV with [facility]. Would rechallege with Statin (Crestor 5 mg P.O. q week)  Nanetta Batty  J 12/01/2011 9:31 AM

## 2011-12-01 NOTE — Discharge Summary (Signed)
Physician Discharge Summary  Patient ID: Calvin Patterson MRN: [id number] DOB/AGE: Jan 13, 1951 61 y.o.  Admit date: 11/30/2011 Discharge date: 12/01/2011  Admission Diagnoses:  Discharge Diagnoses:  Principal Problem:  *Unstable angina Active Problems:  NSTEMI (non-ST elevated myocardial infarction), 11/30/11  S/P percutaneous transluminal coronary angioplasty, with cutting baloon, av groove of LCX, 11/30/11  HTN (hypertension)  Dyslipidemia  Drug-induced bradycardia, and hypotension with NTG   Discharged Condition: stable  Hospital Course:  This is a 61 y.o. male patient of Dr. Eric Form, who has never seen a cardiologist, with a past medical history significant for hypertension, dyslipidemia and intolerance to statins, and family history of premature coronary disease with his father having an MI at age 45. He had an episode of chest pressure which awakened him from sleep at approximately 3 AM the morning of 11/30/2011. He said he never had any symptoms like this before and it was clearly different than any reflux symptoms which he has very infrequently. He has no history of sleep apnea or concerning features for this. He's chest pressure persisted for about 15 minutes at which time he felt that he should go to the emergency room. Upon arrival he was given two sublingual nitroglycerin, and subsequently he developed hypotension and bradycardia with heart rates in the 30s. This responded to to 1 L normal saline fluid boluses. His chest pressure did resolve with nitroglycerin.  EKG showed no acute ischemic changes. Troponin peaked at 0.55.  The patient was taken to the cath lab where he received percutaneous transluminal coronary angioplasty, with cutting baloon, av groove of LCX.  The patient is currently in stable condition and has been seen by Dr. Allyson Sabal who feels he is ready for DC home.  We will restart Crestor at 5mg  weekly due to previous intorlerance, ASA and Brilinta. Patient may return to work  December 11, 2011.  Significant Diagnostic Studies:  Left heart Cath/PCI, 11/30/2011 Angiographic Findings:  1. The left main coronary artery is free of significant atherosclerosis and bifurcates in the usual fashion into the left anterior descending artery and left circumflex coronary artery.  2. The left anterior descending artery is a large vessel that reaches the apex and generates Two major diagonal branches. There is evidence of minimal luminal irregularities and no calcification. No hemodynamically meaningful stenoses are seen.  3. The left circumflex coronary artery is a very large-size vessel but non dominant vessel that generates five major oblque marginal arteries, of which the second is by far the largest. There is evidence of mild diffuse luminal irregularities and no calcification. The only hemodynamically meaningful stenosis is a 85-90% irregular lesion seen immediately downstream of the large second OM, upstream of the last three OM/PLV branches.  4. The right coronary artery is a medium-size dominant vessel that generates a fairly short posterior lateral ventricular system as well as the PDA. There is evidence of moderate luminal irregularities and no calcification. A 30-40% smooth tubular lesion is seen in the mid vessel, followed by a slightly ectatic area.  5. The left ventricle was catheterized, but not injected. There is no aortic valve stenosis by pullback. The left ventricular end-diastolic pressure is 12 mm Hg.   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  Intervention  Lesion #1: AV Groove Circumflex, ostium at Major OM 2 bifurcation  Pre-PCI Stenosis: 90 % Post-PCI Stenosis: Less than 20 %  TIMI 3 flow TIMI 3 flow  Guide Catheter: 6 [place] XB 3  Guidewire: BMW  Pre-Dilitation Balloon: Flextome Cutting balloon 2.0 mm x 6 mm  1st Inflation: 4 Atm for 30 Sec  2nd Inflation: 6 Atm for 48 Sec  3rd Inflation: 6 Atm for 45 Sec    4th inflation: 8 Atm for 45 Sec  Scout angiography did not reveal evidence of dissection or perforation  Balloon #2: Flexetome Cutting Balloon 2.25 mm x 6 mm  Deployment: 2 Atm for 32 Sec  2nd Inflation: 8 Atm for 115 Sec  IC nitroglycerin 200 mcg x1  Scout angiography did not reveal evidence of dissection or perforation, but after waiting 5 minutes there did appear to be some recoil therefore decision is to proceed further dilatation  Final Balloon: Flexetome Cutting Balloon 2.25 mm x 6 mm  1st Inflation: 4 Atm for 90 Sec  Final angiography with and without wire in place revealed minimal recoil, no dissection or perforation with TIMI 3 flow restored.  Procedures performed:  Successful Cutting Balloon Atherectomy/Angioplasty of the AV groove circumflex reducing to less than 20% stenosis  Discharge Exam: Blood pressure 124/76, pulse 80, temperature 99.5 F (37.5 C), temperature source Oral, resp. rate 24, weight 79.8 kg (175 lb 14.8 oz), SpO2 99.00%.   Disposition: Final discharge disposition not confirmed  Discharge Orders    Future Orders Please Complete By Expires   Diet - low sodium heart healthy      Increase activity slowly      Discharge instructions      Comments:   OK to return to work on December 11, 2011 but not before.     Medication List  As of 12/01/2011 10:03 AM   START taking these medications         aspirin 81 MG chewable tablet   Chew 1 tablet (81 mg total) by mouth daily.      rosuvastatin 5 MG tablet   Commonly known as: CRESTOR   Take 1 tablet (5 mg total) by mouth once a week.      Ticagrelor 90 MG Tabs tablet   Commonly known as: BRILINTA   Take 1 tablet (90 mg total) by mouth 2 (two) times daily.         CONTINUE taking these medications         CO Q 10 PO      lisinopril 20 MG tablet   Commonly known as: PRINIVIL,ZESTRIL      multivitamins ther. w/minerals Tabs      RED YEAST RICE PO          Where to get your medications    These  are the prescriptions that you need to pick up.   You may get these medications from any pharmacy.         aspirin 81 MG chewable tablet   rosuvastatin 5 MG tablet   Ticagrelor 90 MG Tabs tablet           Follow-up Information    Follow up with HILTY,Kenneth C. (Appt Time:  our Office will call with time and  date.)    Contact information:   [address] [place] [address] [phone]          Signed: Dwana Melena 12/01/2011, 10:03 AM

## 2011-12-01 NOTE — Progress Notes (Addendum)
CARDIAC REHAB PHASE I   PRE:  Rate/Rhythm: 72 SR  BP:  Supine:   Sitting: 100/70  Standing:    SaO2: 97% RA  MODE:  Ambulation: 340 ft   POST:  Rate/Rhythem: 70 SR  BP:  Supine:   Sitting: 124/76  Standing:    SaO2: 97% RA  Pt ambulated steady. No c/o cp or sob. Tolerated very well. Education done. Permission for out pt rehab.   0730 - 0835 Rosalie Doctor

## 2011-12-01 NOTE — Progress Notes (Signed)
   CARE MANAGEMENT NOTE 12/01/2011  Patient:  Calvin Patterson, Calvin Patterson   Account Number:  [id number]  Date Initiated:  12/01/2011  Documentation initiated by:  GRAVES-BIGELOW,Patria Warzecha  Subjective/Objective Assessment:   Pt admitted with cp. Plan for home on brilinta. Pt is from home with wife. He uses [employer]. Medication not available until Monday so CM called [facility] on [address].     Action/Plan:   RN will [facility] to [facility] due to only bottle they have available. RN provided pt with brilinta card. MD please write rx for 30 day free/ no refills and original with refills. Thanks   Anticipated DC Date:  12/01/2011   Anticipated DC Plan:  HOME/SELF CARE      DC Planning Services  CM consult  Medication Assistance      Choice offered to / List presented to:             Status of service:  Completed, signed off Medicare Important Message given?   (If response is "NO", the following Medicare IM given date fields will be blank) Date Medicare IM given:   Date Additional Medicare IM given:    Discharge Disposition:  HOME/SELF CARE  Per UR Regulation:    Comments:

## 2012-03-22 ENCOUNTER — Encounter: Payer: Self-pay | Admitting: Internal Medicine

## 2012-05-17 ENCOUNTER — Encounter: Payer: Self-pay | Admitting: Internal Medicine

## 2012-07-02 ENCOUNTER — Ambulatory Visit (AMBULATORY_SURGERY_CENTER): Payer: Managed Care, Other (non HMO) | Admitting: *Deleted

## 2012-07-02 VITALS — Ht 69.0 in | Wt 179.7 lb

## 2012-07-02 DIAGNOSIS — Z1211 Encounter for screening for malignant neoplasm of colon: Secondary | ICD-10-CM

## 2012-07-02 MED ORDER — MOVIPREP 100 G PO SOLR: ORAL | Status: DC

## 2012-07-02 NOTE — Progress Notes (Signed)
Talked with Cathlyn Parsons, CRNA about pt's medical history. (Pt has hx of unstable angina, MI January 2013).  He will review pt's chart.

## 2012-07-23 ENCOUNTER — Encounter: Payer: Self-pay | Admitting: Internal Medicine

## 2012-07-23 ENCOUNTER — Ambulatory Visit (AMBULATORY_SURGERY_CENTER): Payer: Managed Care, Other (non HMO) | Admitting: Internal Medicine

## 2012-07-23 VITALS — BP 122/76 | HR 62 | Temp 97.5°F | Resp 15 | Ht 69.0 in | Wt 179.0 lb

## 2012-07-23 DIAGNOSIS — D126 Benign neoplasm of colon, unspecified: Secondary | ICD-10-CM

## 2012-07-23 DIAGNOSIS — Z1211 Encounter for screening for malignant neoplasm of colon: Secondary | ICD-10-CM

## 2012-07-23 MED ORDER — SODIUM CHLORIDE 0.9 % IV SOLN: 500.0000 mL | INTRAVENOUS | Status: DC

## 2012-07-23 NOTE — Progress Notes (Signed)
No complaints noted in the recovery room. Maw  Patient did not experience any of the following events: a burn prior to discharge; a fall within the facility; wrong site/side/patient/procedure/implant event; or a hospital transfer or hospital admission upon discharge from the facility. (G8907) Patient did not have preoperative order for IV antibiotic SSI prophylaxis. (G8918)  

## 2012-07-23 NOTE — Op Note (Signed)
[facility] [address] [place], [address]   COLONOSCOPY PROCEDURE REPORT  PATIENT: Calvin Patterson, Calvin Patterson.  MR#: [id number] BIRTHDATE: 24-Jan-1951 , 60  yrs. old GENDER: Male ENDOSCOPIST: Roxy Cedar, MD REFERRED [id number] Recall PROCEDURE DATE:  07/23/2012 PROCEDURE:   Colonoscopy with snare polypectomy    x 2 ASA CLASS:   Class II INDICATIONS:average risk patient for colon cancer. MEDICATIONS: MAC sedation, administered by CRNA and propofol (Diprivan) 250mg  IV  DESCRIPTION OF PROCEDURE:   After the risks benefits and alternatives of the procedure were thoroughly explained, informed consent was obtained.  A digital rectal exam revealed no abnormalities of the rectum.   The LB [id number]  endoscope was introduced through the anus and advanced to the cecum, which was identified by both the appendix and ileocecal valve. No adverse events experienced.   The quality of the prep was good, using MoviPrep  The instrument was then slowly withdrawn as the colon was fully examined.      COLON FINDINGS: Two diminutive polyps were found at the cecum.  A polypectomy was performed with a cold snare.  The resection was complete and the polyp tissue was completely retrieved.   Moderate diverticulosis was noted in the sigmoid colon.   The colon mucosa was otherwise normal.  Retroflexed views revealed internal hemorrhoids. The time to cecum=5 minutes 17 seconds  Withdrawal time=16 minutes 21 seconds.  The scope was withdrawn and the procedure completed. COMPLICATIONS: There were no complications.  ENDOSCOPIC IMPRESSION: 1.   Two diminutive polyps were found at the cecum; polypectomy was performed with a cold snare 2.   Moderate diverticulosis was noted in the sigmoid colon 3.   The colon mucosa was otherwise normal  RECOMMENDATIONS: 1. Repeat colonoscopy in 5 years if polyp adenomatous; otherwise 10 years  eSigned:  Roxy Cedar, MD 07/23/2012 11:07  AM   cc: The Patient and W.  Buren Kos, MD

## 2012-07-23 NOTE — Patient Instructions (Addendum)
Handouts were given to your care partner on diverticulosis, high fiber diet, hemorrhoids and polyps.  You may resume your prior medications today.  Please call if any questions or concerns.    YOU HAD AN ENDOSCOPIC PROCEDURE TODAY AT THE [facility]: Refer to the procedure report that was given to you for any specific questions about what was found during the examination.  If the procedure report does not answer your questions, please call your gastroenterologist to clarify.  If you requested that your care partner not be given the details of your procedure findings, then the procedure report has been included in a sealed envelope for you to review at your convenience later.  YOU SHOULD EXPECT: Some feelings of bloating in the abdomen. Passage of more gas than usual.  Walking can help get rid of the air that was put into your GI tract during the procedure and reduce the bloating. If you had a lower endoscopy (such as a colonoscopy or flexible sigmoidoscopy) you may notice spotting of blood in your stool or on the toilet paper. If you underwent a bowel prep for your procedure, then you may not have a normal bowel movement for a few days.  DIET: Your first meal following the procedure should be a light meal and then it is ok to progress to your normal diet.  A half-sandwich or bowl of soup is an example of a good first meal.  Heavy or fried foods are harder to digest and may make you feel nauseous or bloated.  Likewise meals heavy in dairy and vegetables can cause extra gas to form and this can also increase the bloating.  Drink plenty of fluids but you should avoid alcoholic beverages for 24 hours.  ACTIVITY: Your care partner should take you home directly after the procedure.  You should plan to take it easy, moving slowly for the rest of the day.  You can resume normal activity the day after the procedure however you should NOT DRIVE or use heavy machinery for 24 hours (because of the  sedation medicines used during the test).    SYMPTOMS TO REPORT IMMEDIATELY: A gastroenterologist can be reached at any hour.  During normal business hours, 8:30 AM to 5:00 PM Monday through Friday, call [phone].  After hours and on weekends, please call the GI answering service at [phone] who will take a message and have the physician on call contact you.   Following lower endoscopy (colonoscopy or flexible sigmoidoscopy):  Excessive amounts of blood in the stool  Significant tenderness or worsening of abdominal pains  Swelling of the abdomen that is new, acute  Fever of 100F or higher    FOLLOW UP: If any biopsies were taken you will be contacted by phone or by letter within the next 1-3 weeks.  Call your gastroenterologist if you have not heard about the biopsies in 3 weeks.  Our staff will call the home number listed on your records the next business day following your procedure to check on you and address any questions or concerns that you may have at that time regarding the information given to you following your procedure. This is a courtesy call and so if there is no answer at the home number and we have not heard from you through the emergency physician on call, we will assume that you have returned to your regular daily activities without incident.  SIGNATURES/CONFIDENTIALITY: You and/or your care partner have signed paperwork which will be entered  into your electronic medical record.  These signatures attest to the fact that that the information above on your After Visit Summary has been reviewed and is understood.  Full responsibility of the confidentiality of this discharge information lies with you and/or your care-partner.

## 2012-07-24 ENCOUNTER — Telehealth: Payer: Self-pay | Admitting: *Deleted

## 2012-07-24 NOTE — Telephone Encounter (Signed)
  Follow up Call-  Call back number 07/23/2012  Post procedure Call Back phone  # [phone]  Permission to leave phone message Yes     Patient questions:  Do you have a fever, pain , or abdominal swelling? no Pain Score  0 *  Have you tolerated food without any problems? yes  Have you been able to return to your normal activities? yes  Do you have any questions about your discharge instructions: Diet   no Medications  no Follow up visit  no  Do you have questions or concerns about your Care? no  Actions: * If pain score is 4 or above: No action needed, pain <4.

## 2012-07-30 ENCOUNTER — Encounter: Payer: Self-pay | Admitting: Internal Medicine

## 2012-11-30 IMAGING — CR DG CHEST 1V PORT
1 series · 1 of 1 positions shown · non-contrast
Comparison: None.

CLINICAL DATA: Chest pain

PORTABLE CHEST - 1 VIEW

[AP]
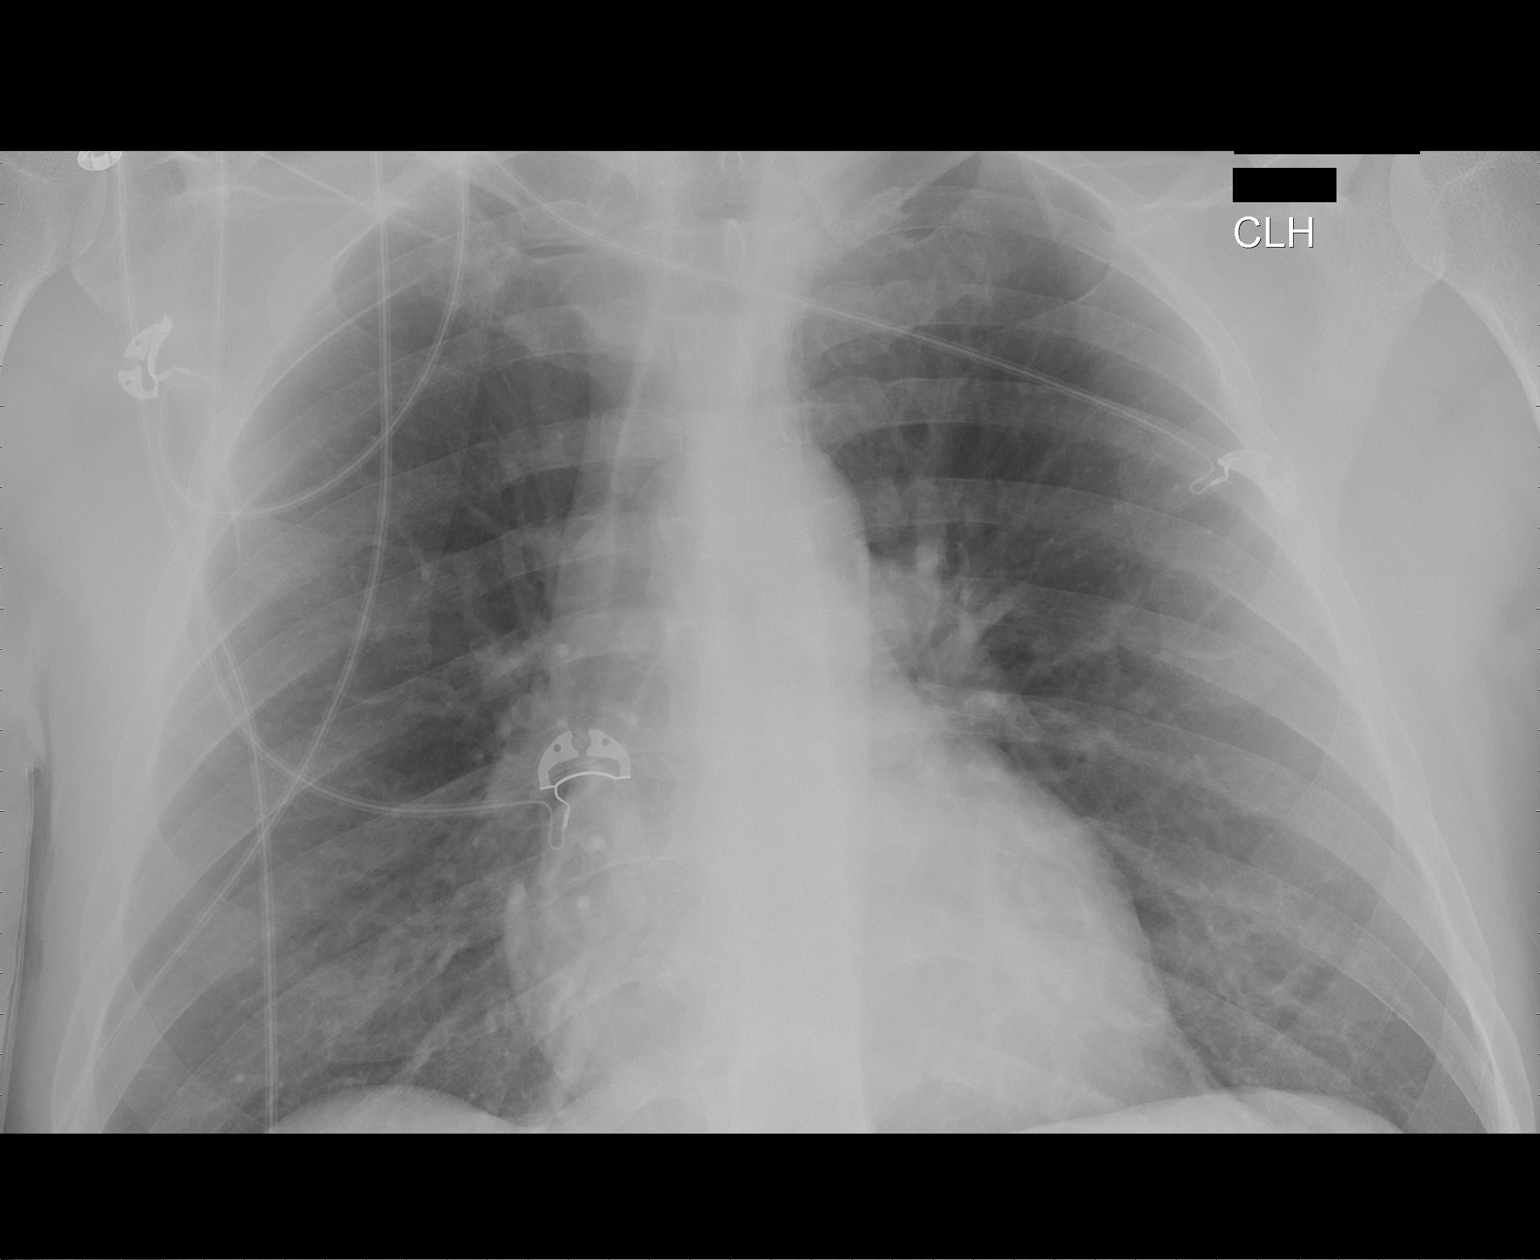

[1 of 1 positions shown; findings below may reference images not displayed]

FINDINGS: Normal heart size and pulmonary vascularity for
technique.  No focal airspace consolidation in the lungs.  The
costophrenic angles are not included on the film.  No pneumothorax.
IMPRESSION: No evidence of active pulmonary disease.

## 2013-05-22 ENCOUNTER — Encounter: Payer: Self-pay | Admitting: Internal Medicine

## 2013-05-22 ENCOUNTER — Ambulatory Visit (INDEPENDENT_AMBULATORY_CARE_PROVIDER_SITE_OTHER): Payer: Managed Care, Other (non HMO) | Admitting: Internal Medicine

## 2013-05-22 VITALS — BP 102/70 | HR 62 | Ht 69.0 in | Wt 183.7 lb

## 2013-05-22 DIAGNOSIS — I214 Non-ST elevation (NSTEMI) myocardial infarction: Secondary | ICD-10-CM

## 2013-05-22 DIAGNOSIS — I2510 Atherosclerotic heart disease of native coronary artery without angina pectoris: Secondary | ICD-10-CM

## 2013-05-22 DIAGNOSIS — I200 Unstable angina: Secondary | ICD-10-CM

## 2013-05-22 DIAGNOSIS — E785 Hyperlipidemia, unspecified: Secondary | ICD-10-CM

## 2013-05-22 DIAGNOSIS — I10 Essential (primary) hypertension: Secondary | ICD-10-CM

## 2013-05-22 NOTE — Progress Notes (Signed)
OFFICE NOTE  Chief Complaint:  Routine office visit  Primary Care Physician: Kari Baars, MD  HPI:  Calvin Patterson  is a 62 year old gentleman, who I have been following for a history of a non-ST elevation MI in January 2013. He underwent cardiac catheterization, which showed a high grade circumflex lesion, underwent cutting balloon atherectomy with marked improvement of his symptoms. He was on aspirin and Brilinta following that; however, in April, developed a hyphema or perhaps some other type of bleeding disorder to the eye, which he then discontinued his Brilinta. He has done fine since that time and is active, walking two to three miles, one to three times a week without any symptoms. Overall denies any chest pain, shortness of breath, palpitations, presyncope, or syncopal symptoms. His only concerns have to do with muscle aches and pains which he partially attributes to his statin medications.  PMHx:  Past Medical History  Diagnosis Date  . Hypertension   . Hypercholesteremia   . Coronary artery disease   . Arthritis   . Angina   . NSTEMI (non-ST elevated myocardial infarction), 11/30/11 11/30/2011  . S/P percutaneous transluminal coronary angioplasty, with cutting baloon, av groove of LCX, 11/30/11 11/30/2011  . HTN (hypertension) 11/30/2011  . Dyslipidemia 11/30/2011  . 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 Diverticulosis     Past Surgical History  Procedure Laterality Date  . Appendectomy  1962  . Tonsillectomy  1958  . Angioplasty  11/30/2011    FAMHx:  Family History  Problem Relation Age of Onset  . Coronary artery disease Father   . Heart disease Father   . Lung cancer Father   . Colon cancer Neg Hx   . Stomach cancer Neg Hx     SOCHx:   reports that he has never smoked. He has never used smokeless tobacco. He reports that he drinks about 0.6 ounces of alcohol per week. He reports that he does not use illicit drugs.  ALLERGIES:  No Known  Allergies  ROS: A comprehensive review of systems was negative except for: Musculoskeletal: positive for myalgias and stiff joints  HOME MEDS: Current Outpatient Prescriptions  Medication Sig Dispense Refill  . aspirin 81 MG tablet Take 81 mg by mouth daily.      Marland Kitchen ezetimibe (ZETIA) 10 MG tablet Take 10 mg by mouth daily.      Marland Kitchen lisinopril (PRINIVIL,ZESTRIL) 20 MG tablet Take 10 mg by mouth daily. Takes 1/2 tablet daily      . Multiple Vitamins-Minerals (MULTIVITAMINS THER. W/MINERALS) TABS Take 1 tablet by mouth daily.        . rosuvastatin (CRESTOR) 5 MG tablet 4 times a week       No current facility-administered medications for this visit.    LABS/IMAGING: No results found for this or any previous visit (from the past 48 hour(s)). No results found.  VITALS: BP 102/70  Pulse 62  Ht 5\' 9"  (1.753 m)  Wt 183 lb 11.2 oz (83.326 kg)  BMI 27.12 kg/m2  EXAM: General appearance: alert and no distress Neck: no adenopathy, no carotid bruit, no JVD, supple, symmetrical, trachea midline and thyroid not enlarged, symmetric, no tenderness/mass/nodules Lungs: clear to auscultation bilaterally Heart: regular rate and rhythm, S1, S2 normal, no murmur, click, rub or gallop Abdomen: soft, non-tender; bowel sounds normal; no masses,  no organomegaly Extremities: extremities normal, atraumatic, no cyanosis or edema Pulses: 2+ and symmetric Skin: Skin color, texture, turgor normal. No rashes or lesions Neurologic: Grossly  normal  EKG: Sinus rhythm at 62  ASSESSMENT: 1. Coronary artery disease status post non-ST elevation MI with cutting balloon atherectomy to the AV circumflex 2. Hypertension 3. Dyslipidemia  PLAN: 1.   Mr. Blatt seems to be doing well without recurrence of his chest pain. Currently he is having problems with statin intolerance. Recently started on zetia and is awaiting repeat blood work.  I think it would be helpful for him to have an NMR with lipid profile to try to  optimize his medications and lower his cardiovascular risk of recurrent heart attack. I've arranged for this test in 3-6 months after medications have been adjusted by you to see where his cholesterol levels are at.  Will plan to see him back annually or sooner as necessary and I'll be happy to assist in cholesterol management if he continues to have intolerance to statins or dyslipidemia which cannot be treated to goal.  Chrystie Nose, MD, [facility] Attending Cardiologist [facility]  Celia Gibbons C 05/22/2013, 5:01 PM

## 2013-05-22 NOTE — Patient Instructions (Signed)
Your physician recommends that you return for lab work in: 2-3 months.  Your physician recommends that you schedule a follow-up appointment in: 1 year

## 2013-12-08 ENCOUNTER — Telehealth: Payer: Self-pay | Admitting: Internal Medicine

## 2013-12-08 DIAGNOSIS — E782 Mixed hyperlipidemia: Secondary | ICD-10-CM

## 2013-12-08 NOTE — Telephone Encounter (Signed)
Returned call and pt verified x 2.  Pt informed message received and asked if he has been back on cholesterol med for at least 8 weeks.  Pt stated he has.  Informed lab order was cancelled and will be reordered.  Advised he present to any [facility] lab fasting to have completed.  Pt verbalized understanding and agreed w/ plan.  Order placed for NMR lipoprofile.

## 2013-12-08 NOTE — Telephone Encounter (Signed)
Was supposed to get bloodwork done as followup for statin drug.  Went off med.  Wants to know if lab order given him is still good (from Sept) Please call

## 2013-12-09 ENCOUNTER — Telehealth: Payer: Self-pay | Admitting: Internal Medicine

## 2013-12-09 NOTE — Telephone Encounter (Signed)
Wants to know if his lab order is ready? Would you please it at the front desk and he will pick it up tomorrow.

## 2013-12-09 NOTE — Telephone Encounter (Signed)
Returned call and pt verified x 2.  Pt informed message received and RN spoke w/ him yesterday and ordered test.  Pt informed it isn't necessary to have lab slip as orders are in computer.  Pt asked for slip anyway, stated he doesn't trust their system.  Lab slip printed and left at front desk for pt to pick up.

## 2013-12-11 LAB — NMR LIPOPROFILE WITH LIPIDS
CHOLESTEROL, TOTAL: 179 mg/dL (ref <200)
HDL PARTICLE NUMBER: 42.8 umol/L (ref >=30.5)
HDL Size: 8.7 nm — ABNORMAL LOW (ref >=9.2)
HDL-C: 53 mg/dL (ref >=40)
LDL CALC: 102 mg/dL — AB (ref <100)
LDL PARTICLE NUMBER: 1295 nmol/L — AB (ref <1000)
LDL Size: 20.3 nm — ABNORMAL LOW (ref >20.5)
LP-IR SCORE: 61 — AB (ref <=45)
Large HDL-P: 4.2 umol/L — ABNORMAL LOW (ref >=4.8)
Large VLDL-P: 2 nmol/L (ref <=2.7)
Small LDL Particle Number: 585 nmol/L — ABNORMAL HIGH (ref <=527)
TRIGLYCERIDES: 120 mg/dL (ref <150)
VLDL SIZE: 49.6 nm — AB (ref <=46.6)

## 2013-12-16 ENCOUNTER — Encounter: Payer: Self-pay | Admitting: *Deleted

## 2013-12-19 ENCOUNTER — Telehealth: Payer: Self-pay | Admitting: Internal Medicine

## 2013-12-19 NOTE — Telephone Encounter (Signed)
Wants his lab results mailed to him please.He had it on 12-02-13.

## 2013-12-19 NOTE — Telephone Encounter (Signed)
Results were mailed on 1.20.15 (see result note).

## 2014-06-01 ENCOUNTER — Encounter: Payer: Self-pay | Admitting: *Deleted

## 2014-06-11 ENCOUNTER — Encounter: Payer: Self-pay | Admitting: Internal Medicine

## 2014-06-12 ENCOUNTER — Encounter: Payer: Self-pay | Admitting: Internal Medicine

## 2014-06-12 ENCOUNTER — Ambulatory Visit (INDEPENDENT_AMBULATORY_CARE_PROVIDER_SITE_OTHER): Payer: Managed Care, Other (non HMO) | Admitting: Internal Medicine

## 2014-06-12 VITALS — BP 116/70 | HR 64 | Ht 69.0 in | Wt 176.4 lb

## 2014-06-12 DIAGNOSIS — I10 Essential (primary) hypertension: Secondary | ICD-10-CM

## 2014-06-12 DIAGNOSIS — Z9861 Coronary angioplasty status: Secondary | ICD-10-CM

## 2014-06-12 DIAGNOSIS — E785 Hyperlipidemia, unspecified: Secondary | ICD-10-CM

## 2014-06-12 NOTE — Progress Notes (Signed)
OFFICE NOTE  Chief Complaint:  Routine office visit  Primary Care Physician: Marton Redwood, MD  HPI:  Calvin Patterson  is a 63 year old gentleman, who I have been following for a history of a non-ST elevation MI in January 2013. He underwent cardiac catheterization, which showed a high grade circumflex lesion, underwent cutting balloon atherectomy with marked improvement of his symptoms. He was on aspirin and Brilinta following that; however, in April, developed a hyphema or perhaps some other type of bleeding disorder to the eye, which he then discontinued his Brilinta. He has done fine since that time and is active, walking two to three miles, one to three times a week without any symptoms. Overall denies any chest pain, shortness of breath, palpitations, presyncope, or syncopal symptoms. His only concerns have to do with muscle aches and pains which he partially attributes to his statin medications.  [name] returns today and is feeling quite well. He remains active and denies any shortness of breath or chest pain. In January he underwent a lipid NMR which demonstrated a LDL particle number of 1295, and LDL content of 106. He has since been more regularly taking Crestor 5 mg for 2 days and then off for one day. This seems to work at minimizing his myalgias. Otherwise he has no complaints.  PMHx:  Past Medical History  Diagnosis Date  . Hypertension   . Hypercholesteremia   . Coronary artery disease   . Arthritis   . Angina   . NSTEMI (non-ST elevated myocardial infarction), 11/30/11 11/30/2011  . S/P percutaneous transluminal coronary angioplasty, with cutting baloon, av groove of LCX, 11/30/11 11/30/2011  . HTN (hypertension) 11/30/2011  . Dyslipidemia 11/30/2011  . 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 Diverticulosis     Past Surgical History  Procedure Laterality Date  . Appendectomy  1962  . Tonsillectomy  1958  . Cardiac catheterization  11/30/2011      high grade stable lesion in left Cfx (Dr. Jerilynn Mages. Croitoru) - atherectomyy & angioplasty of AV groove Cfx with Flexetome Cutting Balloon by Dr. Roni Bread    FAMHx:  Family History  Problem Relation Age of Onset  . Coronary artery disease Father   . Heart disease Father   . Lung cancer Father   . Colon cancer Neg Hx   . Stomach cancer Neg Hx   . Heart failure Mother   . Dementia Mother   . Heart disease Paternal Grandmother   . Heart disease Paternal Grandfather     SOCHx:   reports that he has never smoked. He has never used smokeless tobacco. He reports that he drinks about .6 - 1.8 ounces of alcohol per week. He reports that he does not use illicit drugs.  ALLERGIES:  Allergies  Allergen Reactions  . Statins     ROS: A comprehensive review of systems was negative.  HOME MEDS: Current Outpatient Prescriptions  Medication Sig Dispense Refill  . aspirin 81 MG tablet Take 81 mg by mouth daily.      Marland Kitchen lisinopril (PRINIVIL,ZESTRIL) 20 MG tablet Take 10 mg by mouth daily. Takes 1/2 tablet daily      . Multiple Vitamins-Minerals (MULTIVITAMINS THER. W/MINERALS) TABS Take 1 tablet by mouth daily.        . rosuvastatin (CRESTOR) 5 MG tablet 2 days on, 1 day off.       No current facility-administered medications for this visit.    LABS/IMAGING: No results found for this or any  previous visit (from the past 48 hour(s)). No results found.  VITALS: BP 116/70  Pulse 64  Ht 5\' 9"  (1.753 m)  Wt 176 lb 6.4 oz (80.015 kg)  BMI 26.04 kg/m2  EXAM: General appearance: alert and no distress Neck: no adenopathy, no carotid bruit, no JVD, supple, symmetrical, trachea midline and thyroid not enlarged, symmetric, no tenderness/mass/nodules Lungs: clear to auscultation bilaterally Heart: regular rate and rhythm, S1, S2 normal, no murmur, click, rub or gallop Abdomen: soft, non-tender; bowel sounds normal; no masses,  no organomegaly Extremities: extremities normal, atraumatic, no  cyanosis or edema Pulses: 2+ and symmetric Skin: Skin color, texture, turgor normal. No rashes or lesions Neurologic: Grossly normal  EKG: Sinus rhythm at 64  ASSESSMENT: 1. Coronary artery disease status post non-ST elevation MI with cutting balloon atherectomy to the AV circumflex 2. Hypertension 3. Dyslipidemia  PLAN: 1.   [name] seems to be doing well without recurrence of his chest pain. He is more tolerant of his current medication regimen with Crestor for 2 days on and one day off. I did recommend that he may try taking coenzyme Q10 300 mg daily as there is some data that this may decrease his risk of myalgias. Also be appropriate for him to have adequate vitamin D levels which aren't sure you've already addressed. Blood pressure is well controlled. He is having no chest pain. I've encouraged to stay active and continue eating a healthy diet. We'll plan to see him back in earlier sooner as necessary.  Pixie Casino, MD, [facility] Attending Cardiologist [facility] [place] C 06/12/2014, 9:43 AM

## 2014-06-12 NOTE — Patient Instructions (Signed)
Coenzyme Q-10 300mg   Your physician wants you to follow-up in: 1 year. You will receive a reminder letter in the mail two months in advance. If you don't receive a letter, please call our office to schedule the follow-up appointment.

## 2014-11-05 ENCOUNTER — Encounter (HOSPITAL_COMMUNITY): Payer: Self-pay | Admitting: Cardiovascular Disease

## 2015-02-18 ENCOUNTER — Other Ambulatory Visit: Payer: Self-pay | Admitting: Internal Medicine

## 2015-02-18 DIAGNOSIS — R1012 Left upper quadrant pain: Secondary | ICD-10-CM

## 2015-02-25 ENCOUNTER — Ambulatory Visit
Admission: RE | Admit: 2015-02-25 | Discharge: 2015-02-25 | Disposition: A | Discharge status: Home / Self Care | Payer: Managed Care, Other (non HMO) | Source: Ambulatory Visit | Attending: Internal Medicine | Admitting: Internal Medicine

## 2015-02-25 DIAGNOSIS — R1012 Left upper quadrant pain: Secondary | ICD-10-CM

## 2015-02-25 MED ORDER — IOPAMIDOL (ISOVUE-300) INJECTION 61%
100.0000 mL | Freq: Once | INTRAVENOUS | Status: AC | PRN
  Administered 2015-02-25: 100 mL via INTRAVENOUS

## 2015-03-16 ENCOUNTER — Encounter: Payer: Self-pay | Admitting: Internal Medicine

## 2015-05-10 ENCOUNTER — Encounter: Payer: Self-pay | Admitting: Internal Medicine

## 2015-06-24 ENCOUNTER — Encounter: Payer: Self-pay | Admitting: Internal Medicine

## 2015-06-24 ENCOUNTER — Ambulatory Visit (INDEPENDENT_AMBULATORY_CARE_PROVIDER_SITE_OTHER): Payer: Managed Care, Other (non HMO) | Admitting: Internal Medicine

## 2015-06-24 VITALS — BP 116/75 | HR 61 | Ht 69.0 in | Wt 177.3 lb

## 2015-06-24 DIAGNOSIS — I10 Essential (primary) hypertension: Secondary | ICD-10-CM

## 2015-06-24 DIAGNOSIS — E785 Hyperlipidemia, unspecified: Secondary | ICD-10-CM | POA: Diagnosis not present

## 2015-06-24 DIAGNOSIS — Z9861 Coronary angioplasty status: Secondary | ICD-10-CM

## 2015-06-24 NOTE — Progress Notes (Signed)
OFFICE NOTE  Chief Complaint:  Routine office visit  Primary Care Physician: Marton Redwood, MD  HPI:  Calvin Patterson  is a 64 year old gentleman, who I have been following for a history of a non-ST elevation MI in January 2013. He underwent cardiac catheterization, which showed a high grade circumflex lesion, underwent cutting balloon atherectomy with marked improvement of his symptoms. He was on aspirin and Brilinta following that; however, in April, developed a hyphema or perhaps some other type of bleeding disorder to the eye, which he then discontinued his Brilinta. He has done fine since that time and is active, walking two to three miles, one to three times a week without any symptoms. Overall denies any chest pain, shortness of breath, palpitations, presyncope, or syncopal symptoms. His only concerns have to do with muscle aches and pains which he partially attributes to his statin medications.  Calvin Patterson returns today and is feeling quite well. He remains active and denies any shortness of breath or chest pain. In January he underwent a lipid NMR which demonstrated a LDL particle number of 1295, and LDL content of 106. He has since been more regularly taking Crestor 5 mg for 2 days and then off for one day. This seems to work at minimizing his myalgias. Otherwise he has no complaints.   Calvin Patterson returns today for follow-up.  Overall he reports doing really well. He has good energy level and no chest pain or worsening shortness of breath. Recently had lab work done through his primary care provider which indicated a total cholesterol 184, triglycerides 117, HDL 62 and LDL 99. Marked improvement however he has been a little intolerant to the Crestor. Currently he is taking 5 mg every other day. In addition he was recently started on Zetia which I think will be additionally helpful to reach his cholesterol. He is on low-dose lisinopril and blood pressure is low to normal but  otherwise well controlled. Energy level is good and he remains active.  PMHx:  Past Medical History  Diagnosis Date  . Hypertension   . Hypercholesteremia   . Coronary artery disease   . Arthritis   . Angina   . NSTEMI (non-ST elevated myocardial infarction), 11/30/11 11/30/2011  . S/P percutaneous transluminal coronary angioplasty, with cutting baloon, av groove of LCX, 11/30/11 11/30/2011  . HTN (hypertension) 11/30/2011  . Dyslipidemia 11/30/2011  . 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 Diverticulosis     Past Surgical History  Procedure Laterality Date  . Appendectomy  1962  . Tonsillectomy  1958  . Cardiac catheterization  11/30/2011    high grade stable lesion in left Cfx (Dr. Jerilynn Mages. Croitoru) - atherectomyy & angioplasty of AV groove Cfx with Flexetome Cutting Balloon by Dr. Roni Bread  . Left heart catheterization with coronary angiogram N/A 11/30/2011    Procedure: LEFT HEART CATHETERIZATION WITH CORONARY ANGIOGRAM;  Surgeon: Sanda Klein, MD;  Location: [place] CATH LAB;  Service: Cardiovascular;  Laterality: N/A;  . Percutaneous coronary intervention-balloon only  11/30/2011    Procedure: PERCUTANEOUS CORONARY INTERVENTION-BALLOON ONLY;  Surgeon: Sanda Klein, MD;  Location: [place] CATH LAB;  Service: Cardiovascular;;    FAMHx:  Family History  Problem Relation Age of Onset  . Coronary artery disease Father   . Heart disease Father   . Lung cancer Father   . Colon cancer Neg Hx   . Stomach cancer Neg Hx   . Heart failure Mother   . Dementia Mother   .  Heart disease Paternal Grandmother   . Heart disease Paternal Grandfather     SOCHx:   reports that he has never smoked. He has never used smokeless tobacco. He reports that he drinks about 0.6 - 1.8 oz of alcohol per week. He reports that he does not use illicit drugs.  ALLERGIES:  Allergies  Allergen Reactions  . Statins     ROS: A comprehensive review of systems was negative.  HOME MEDS: Current  Outpatient Prescriptions  Medication Sig Dispense Refill  . aspirin 81 MG tablet Take 81 mg by mouth daily.    Marland Kitchen ezetimibe (ZETIA) 10 MG tablet Take 10 mg by mouth daily.    Marland Kitchen lisinopril (PRINIVIL,ZESTRIL) 20 MG tablet Take 10 mg by mouth daily. Takes 1/2 tablet daily    . Multiple Vitamins-Minerals (MULTIVITAMINS THER. W/MINERALS) TABS Take 1 tablet by mouth daily.      . rosuvastatin (CRESTOR) 5 MG tablet Take 5 mg by mouth every other day.    . rosuvastatin (CRESTOR) 5 MG tablet 2 days on, 1 day off.     No current facility-administered medications for this visit.    LABS/IMAGING: No results found for this or any previous visit (from the past 48 hour(s)). No results found.  VITALS: BP 116/75 mmHg  Pulse 61  Ht 5\' 9"  (1.753 m)  Wt 177 lb 4.8 oz (80.423 kg)  BMI 26.17 kg/m2  EXAM: General appearance: alert and no distress Neck: no adenopathy, no carotid bruit, no JVD, supple, symmetrical, trachea midline and thyroid not enlarged, symmetric, no tenderness/mass/nodules Lungs: clear to auscultation bilaterally Heart: regular rate and rhythm, S1, S2 normal, no murmur, click, rub or gallop Abdomen: soft, non-tender; bowel sounds normal; no masses,  no organomegaly Extremities: extremities normal, atraumatic, no cyanosis or edema Pulses: 2+ and symmetric Skin: Skin color, texture, turgor normal. No rashes or lesions Neurologic: Grossly normal  EKG: Sinus rhythm at 61  ASSESSMENT: 1. Coronary artery disease status post non-ST elevation MI with cutting balloon atherectomy to the AV circumflex 2. Hypertension 3. Dyslipidemia  PLAN: 1.   Mr. Bord seems to be doing well without recurrence of his chest pain. Hypertension is at goal. Cholesterol is improved and perhaps better now that he is on that area. He will ever recheck his cholesterol in a few months. He's had no recurrent chest pain symptoms. He continues to be active. He is taking coenzyme Q10 which I think will be  additionally beneficial for him. Overall please without his progress is going to plan to see him back annually or sooner as necessary.  Calvin Casino, MD, [facility] Attending Cardiologist [place] 06/24/2015, 10:27 AM

## 2015-06-24 NOTE — Patient Instructions (Signed)
Your physician wants you to follow-up in: 1 year with Dr. Hilty. You will receive a reminder letter in the mail two months in advance. If you don't receive a letter, please call our office to schedule the follow-up appointment.  

## 2015-06-25 ENCOUNTER — Encounter: Payer: Self-pay | Admitting: Internal Medicine

## 2016-06-16 ENCOUNTER — Encounter: Payer: Self-pay | Admitting: Internal Medicine

## 2016-06-16 ENCOUNTER — Ambulatory Visit (INDEPENDENT_AMBULATORY_CARE_PROVIDER_SITE_OTHER): Payer: Managed Care, Other (non HMO) | Admitting: Internal Medicine

## 2016-06-16 VITALS — BP 118/77 | HR 64 | Ht 69.0 in | Wt 177.6 lb

## 2016-06-16 DIAGNOSIS — I10 Essential (primary) hypertension: Secondary | ICD-10-CM | POA: Diagnosis not present

## 2016-06-16 DIAGNOSIS — E785 Hyperlipidemia, unspecified: Secondary | ICD-10-CM | POA: Diagnosis not present

## 2016-06-16 DIAGNOSIS — Z9861 Coronary angioplasty status: Secondary | ICD-10-CM

## 2016-06-16 NOTE — Patient Instructions (Signed)
Talk to your primary care MD about switching Crestor to Livalo or consider Repatha for goal LDL less than 29  Your physician wants you to follow-up in: 1 year with Dr. Debara Pickett. You will receive a reminder letter in the mail two months in advance. If you don't receive a letter, please call our office to schedule the follow-up appointment.

## 2016-06-16 NOTE — Progress Notes (Signed)
OFFICE NOTE  Chief Complaint:  Routine office visit  Primary Care Physician: Marton Redwood, MD  HPI:  Calvin Patterson  is a 65 year old gentleman, who I have been following for a history of a non-ST elevation MI in January 2013. He underwent cardiac catheterization, which showed a high grade circumflex lesion, underwent cutting balloon atherectomy with marked improvement of his symptoms. He was on aspirin and Brilinta following that; however, in April, developed a hyphema or perhaps some other type of bleeding disorder to the eye, which he then discontinued his Brilinta. He has done fine since that time and is active, walking two to three miles, one to three times a week without any symptoms. Overall denies any chest pain, shortness of breath, palpitations, presyncope, or syncopal symptoms. His only concerns have to do with muscle aches and pains which he partially attributes to his statin medications.  Calvin Patterson returns today and is feeling quite well. He remains active and denies any shortness of breath or chest pain. In January he underwent a lipid NMR which demonstrated a LDL particle number of 1295, and LDL content of 106. He has since been more regularly taking Crestor 5 mg for 2 days and then off for one day. This seems to work at minimizing his myalgias. Otherwise he has no complaints.   Calvin Patterson returns today for follow-up.  Overall he reports doing really well. He has good energy level and no chest pain or worsening shortness of breath. Recently had lab work done through his primary care provider which indicated a total cholesterol 184, triglycerides 117, HDL 62 and LDL 99. Marked improvement however he has been a little intolerant to the Crestor. Currently he is taking 5 mg every other day. In addition he was recently started on Zetia which I think will be additionally helpful to reach his cholesterol. He is on low-dose lisinopril and blood pressure is low to normal but  otherwise well controlled. Energy level is good and he remains active.  06/16/2016  Calvin Patterson was seen in follow-up today in the office. He seems to be doing well. He recently went on a hiking and fishing trip in [place] and was asymptomatic with that. He exercises regularly. Recent cholesterol still shows LDL in the 90s. He's been intolerant of Crestor and can take it very infrequently only. He is on Zetia. He's very care provider was talking to him about possibly adding a PC SK 9 inhibitor.  PMHx:  Past Medical History  Diagnosis Date  . Hypertension   . Hypercholesteremia   . Coronary artery disease   . Arthritis   . Angina   . NSTEMI (non-ST elevated myocardial infarction), 11/30/11 11/30/2011  . S/P percutaneous transluminal coronary angioplasty, with cutting baloon, av groove of LCX, 11/30/11 11/30/2011  . HTN (hypertension) 11/30/2011  . Dyslipidemia 11/30/2011  . 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 Diverticulosis     Past Surgical History  Procedure Laterality Date  . Appendectomy  1962  . Tonsillectomy  1958  . Cardiac catheterization  11/30/2011    high grade stable lesion in left Cfx (Dr. Jerilynn Mages. Croitoru) - atherectomyy & angioplasty of AV groove Cfx with Flexetome Cutting Balloon by Dr. Roni Bread  . Left heart catheterization with coronary angiogram N/A 11/30/2011    Procedure: LEFT HEART CATHETERIZATION WITH CORONARY ANGIOGRAM;  Surgeon: Sanda Klein, MD;  Location: [facility];  Service: Cardiovascular;  Laterality: N/A;  . Percutaneous coronary intervention-balloon only  11/30/2011  Procedure: PERCUTANEOUS CORONARY INTERVENTION-BALLOON ONLY;  Surgeon: Sanda Klein, MD;  Location: [facility];  Service: Cardiovascular;;    FAMHx:  Family History  Problem Relation Age of Onset  . Coronary artery disease Father   . Heart disease Father   . Lung cancer Father   . Colon cancer Neg Hx   . Stomach cancer Neg Hx   . Heart failure Mother   . Dementia  Mother   . Heart disease Paternal Grandmother   . Heart disease Paternal Grandfather     SOCHx:   reports that he has never smoked. He has never used smokeless tobacco. He reports that he drinks about 0.6 - 1.8 oz of alcohol per week. He reports that he does not use illicit drugs.  ALLERGIES:  Allergies  Allergen Reactions  . Statins     ROS: A comprehensive review of systems was negative.  HOME MEDS: Current Outpatient Prescriptions  Medication Sig Dispense Refill  . aspirin 81 MG tablet Take 81 mg by mouth daily.    Marland Kitchen ezetimibe (ZETIA) 10 MG tablet Take 10 mg by mouth daily.    Marland Kitchen lisinopril (PRINIVIL,ZESTRIL) 20 MG tablet Take 10 mg by mouth daily.     . Multiple Vitamins-Minerals (MULTIVITAMINS THER. W/MINERALS) TABS Take 1 tablet by mouth daily.      . rosuvastatin (CRESTOR) 5 MG tablet Take 1 tablet by mouth every 3rd day. (2 days off, 1 day on)     No current facility-administered medications for this visit.    LABS/IMAGING: No results found for this or any previous visit (from the past 48 hour(s)). No results found.  VITALS: BP 118/77 mmHg  Pulse 64  Ht 5\' 9"  (1.753 m)  Wt 177 lb 9.6 oz (80.559 kg)  BMI 26.22 kg/m2  EXAM: General appearance: alert and no distress Neck: no adenopathy, no carotid bruit, no JVD, supple, symmetrical, trachea midline and thyroid not enlarged, symmetric, no tenderness/mass/nodules Lungs: clear to auscultation bilaterally Heart: regular rate and rhythm, S1, S2 normal, no murmur, click, rub or gallop Abdomen: soft, non-tender; bowel sounds normal; no masses,  no organomegaly Extremities: extremities normal, atraumatic, no cyanosis or edema Pulses: 2+ and symmetric Skin: Skin color, texture, turgor normal. No rashes or lesions Neurologic: Grossly normal  EKG: Sinus rhythm at 64  ASSESSMENT: 1. Coronary artery disease status post non-ST elevation MI with cutting balloon atherectomy to the AV circumflex  (2013) 2. Hypertension 3. Dyslipidemia  PLAN: 1.   Calvin Patterson is doing well and asymptomatic. His blood pressure is at goal. His cholesterol could be better controlled. LDL goal should be less than 70 or lower. Unfortunately he's not been able to increase his Crestor and has been intolerant of other statins as well. He is on Zetia 10 mg. I agree that he may be a good candidate for a PC SK 9 inhibitor but he has a significant phobia of needles. Another alternative may be to consider trying Livalo (pitavastatin), which is less metabolized in the liver and tolerated by a lot of my patients to our unable to take the more mainstream statin medications. I advised him to discuss this with his primary care provider.  Follow-up with me annually or sooner as necessary.  Pixie Casino, MD, [facility] Attending Cardiologist [place] C Deborah Dondero 06/16/2016, 10:05 AM

## 2016-06-27 ENCOUNTER — Ambulatory Visit: Payer: Managed Care, Other (non HMO) | Admitting: Internal Medicine

## 2017-05-02 ENCOUNTER — Encounter: Payer: Self-pay | Admitting: Internal Medicine

## 2017-05-07 ENCOUNTER — Encounter: Payer: Self-pay | Admitting: Internal Medicine

## 2017-06-11 ENCOUNTER — Telehealth: Payer: Self-pay

## 2017-06-11 NOTE — Telephone Encounter (Signed)
Thanks so much. 

## 2017-06-11 NOTE — Telephone Encounter (Signed)
This pt was scheduled for a colon with Dr. Henrene Pastor 07/26/17 at the same time another pt was scheduled for a colon on 07/26/17. There are 2 patients scheduled on 07/26/17 at 10:30am. Can you move this pts procedure to another day? Thanks.

## 2017-06-11 NOTE — Telephone Encounter (Signed)
Appointments cancelled. Dr. Henrene Pastor does not have any openings until October. I will call patient to reschedule when October schedule is available.

## 2017-07-03 ENCOUNTER — Ambulatory Visit (INDEPENDENT_AMBULATORY_CARE_PROVIDER_SITE_OTHER): Payer: 59 | Admitting: Internal Medicine

## 2017-07-03 VITALS — BP 110/72 | HR 67 | Ht 69.0 in | Wt 181.2 lb

## 2017-07-03 DIAGNOSIS — Z9861 Coronary angioplasty status: Secondary | ICD-10-CM

## 2017-07-03 DIAGNOSIS — I10 Essential (primary) hypertension: Secondary | ICD-10-CM

## 2017-07-03 DIAGNOSIS — E785 Hyperlipidemia, unspecified: Secondary | ICD-10-CM | POA: Diagnosis not present

## 2017-07-03 NOTE — Patient Instructions (Signed)
Your physician wants you to follow-up in: ONE YEAR with Dr. Hilty. You will receive a reminder letter in the mail two months in advance. If you don't receive a letter, please call our office to schedule the follow-up appointment.  

## 2017-07-04 NOTE — Progress Notes (Signed)
OFFICE NOTE  Chief Complaint:  No complaints  Primary Care Physician: Marton Redwood, MD  HPI:  Calvin Patterson  is a 67 year old gentleman, who I have been following for a history of a non-ST elevation MI in January 2013. He underwent cardiac catheterization, which showed a high grade circumflex lesion, underwent cutting balloon atherectomy with marked improvement of his symptoms. He was on aspirin and Brilinta following that; however, in April, developed a hyphema or perhaps some other type of bleeding disorder to the eye, which he then discontinued his Brilinta. He has done fine since that time and is active, walking two to three miles, one to three times a week without any symptoms. Overall denies any chest pain, shortness of breath, palpitations, presyncope, or syncopal symptoms. His only concerns have to do with muscle aches and pains which he partially attributes to his statin medications.  Calvin Patterson returns today and is feeling quite well. He remains active and denies any shortness of breath or chest pain. In January he underwent a lipid NMR which demonstrated a LDL particle number of 1295, and LDL content of 106. He has since been more regularly taking Crestor 5 mg for 2 days and then off for one day. This seems to work at minimizing his myalgias. Otherwise he has no complaints.   Calvin Patterson returns today for follow-up.  Overall he reports doing really well. He has good energy level and no chest pain or worsening shortness of breath. Recently had lab work done through his primary care provider which indicated a total cholesterol 184, triglycerides 117, HDL 62 and LDL 99. Marked improvement however he has been a little intolerant to the Crestor. Currently he is taking 5 mg every other day. In addition he was recently started on Zetia which I think will be additionally helpful to reach his cholesterol. He is on low-dose lisinopril and blood pressure is low to normal but otherwise  well controlled. Energy level is good and he remains active.  06/16/2016  Calvin Patterson was seen in follow-up today in the office. He seems to be doing well. He recently went on a hiking and fishing trip in [place] and was asymptomatic with that. He exercises regularly. Recent cholesterol still shows LDL in the 90s. He's been intolerant of Crestor and can take it very infrequently only. He is on Zetia. He's very care provider was talking to him about possibly adding a PC SK 9 inhibitor.  07/03/2017  Calvin Patterson returns today for follow-up. Overall he seems to be doing well. Denies any chest pain or worsening shortness of breath. He is physically active. He reports a healthy diet. His blood pressures been well controlled and is at goal today. Recently I reviewed lab work from his primary care provider, which shows his total cholesterol 146, triglycerides 102, HL-C 57 and LDL-C 69. This is finally at goal on ezetimibe 10 mg and rosuvastatin 5 mg every third day. He reported when taking rosuvastatin every other day he had some myalgias. This regimen seems to be tolerable to him and is getting him to goal.  PMHx:  Past Medical History:  Diagnosis Date  . Angina   . Arthritis   . Coronary artery disease   . Diverticulosis   . 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 Dyslipidemia 11/30/2011  . HTN (hypertension) 11/30/2011  . Hypercholesteremia   . Hypertension   . NSTEMI (non-ST elevated myocardial infarction), 11/30/11 11/30/2011  . S/P percutaneous transluminal coronary angioplasty, with  cutting baloon, av groove of LCX, 11/30/11 11/30/2011    Past Surgical History:  Procedure Laterality Date  . APPENDECTOMY  1962  . CARDIAC CATHETERIZATION  11/30/2011   high grade stable lesion in left Cfx (Dr. Jerilynn Mages. Croitoru) - atherectomyy & angioplasty of AV groove Cfx with Flexetome Cutting Balloon by Dr. Roni Bread  . LEFT HEART CATHETERIZATION WITH CORONARY ANGIOGRAM N/A 11/30/2011   Procedure: LEFT  HEART CATHETERIZATION WITH CORONARY ANGIOGRAM;  Surgeon: Sanda Klein, MD;  Location: [facility] CATH [facility];  Service: Cardiovascular;  Laterality: N/A;  . PERCUTANEOUS CORONARY INTERVENTION-BALLOON ONLY  11/30/2011   Procedure: PERCUTANEOUS CORONARY INTERVENTION-BALLOON ONLY;  Surgeon: Sanda Klein, MD;  Location: [facility] CATH [facility];  Service: Cardiovascular;;  . TONSILLECTOMY  1958    FAMHx:  Family History  Problem Relation Age of Onset  . Coronary artery disease Father   . Heart disease Father   . Lung cancer Father   . Colon cancer Neg Hx   . Stomach cancer Neg Hx   . Heart failure Mother   . Dementia Mother   . Heart disease Paternal Grandmother   . Heart disease Paternal Grandfather     SOCHx:   reports that he has never smoked. He has never used smokeless tobacco. He reports that he drinks about 0.6 - 1.8 oz of alcohol per week . He reports that he does not use drugs.  ALLERGIES:  Allergies  Allergen Reactions  . Statins     ROS: A comprehensive review of systems was negative.  HOME MEDS: Current Outpatient Prescriptions  Medication Sig Dispense Refill  . aspirin 81 MG tablet Take 81 mg by mouth daily.    Marland Kitchen ezetimibe (ZETIA) 10 MG tablet Take 10 mg by mouth daily.    Marland Kitchen lisinopril (PRINIVIL,ZESTRIL) 10 MG tablet Take 10 mg by mouth daily.  3  . Multiple Vitamins-Minerals (MULTIVITAMINS THER. W/MINERALS) TABS Take 1 tablet by mouth daily.      . rosuvastatin (CRESTOR) 5 MG tablet Take 1 tablet by mouth every 3rd day. (2 days off, 1 day on)     No current facility-administered medications for this visit.     LABS/IMAGING: No results found for this or any previous visit (from the past 48 hour(s)). No results found.  VITALS: BP 110/72   Pulse 67   Ht 5\' 9"  (1.753 m)   Wt 181 lb 3.2 oz (82.2 kg)   BMI 26.76 kg/m   EXAM: General appearance: alert and no distress Neck: no adenopathy, no carotid bruit, no JVD, supple, symmetrical, trachea midline and thyroid not enlarged,  symmetric, no tenderness/mass/nodules Lungs: clear to auscultation bilaterally Heart: regular rate and rhythm, S1, S2 normal, no murmur, click, rub or gallop Abdomen: soft, non-tender; bowel sounds normal; no masses,  no organomegaly Extremities: extremities normal, atraumatic, no cyanosis or edema Pulses: 2+ and symmetric Skin: Skin color, texture, turgor normal. No rashes or lesions Neurologic: Grossly normal  EKG: Normal sinus rhythm at 67  ASSESSMENT: 1. Coronary artery disease status post non-ST elevation MI with cutting balloon atherectomy to the AV circumflex (2013) 2. Hypertension 3. Dyslipidemia  PLAN: 1.   Calvin Patterson is doing well and asymptomatic. Cholesterol is finally at goal based on his coronary risk with LDL-C less than 70. He is on appropriate medications for this. His diet is excellent and his weight is appropriate. He exercises regularly which I commended him on. Blood pressure is very well controlled on only today in the office but based on home blood pressure  readings that I reviewed. No changes to his medical regimen today. Follow-up with me annually or sooner as necessary.  Pixie Casino, MD, [facility] Attending Cardiologist [place] C Zara Wendt 07/04/2017, 7:47 AM

## 2017-07-26 ENCOUNTER — Encounter: Payer: Managed Care, Other (non HMO) | Admitting: Internal Medicine

## 2017-08-22 ENCOUNTER — Ambulatory Visit (AMBULATORY_SURGERY_CENTER): Payer: Self-pay | Admitting: *Deleted

## 2017-08-22 VITALS — Ht 69.0 in | Wt 183.6 lb

## 2017-08-22 DIAGNOSIS — Z86010 Personal history of colonic polyps: Secondary | ICD-10-CM

## 2017-08-22 MED ORDER — NA SULFATE-K SULFATE-MG SULF 17.5-3.13-1.6 GM/177ML PO SOLN: ORAL | 0 refills | Status: DC

## 2017-08-22 NOTE — Progress Notes (Signed)
No allergies to eggs or soy. No problems with anesthesia.  Pt given Emmi instructions for colonoscopy  No oxygen use  No diet drug use  

## 2017-08-29 ENCOUNTER — Encounter: Payer: Self-pay | Admitting: Internal Medicine

## 2017-09-12 ENCOUNTER — Ambulatory Visit (AMBULATORY_SURGERY_CENTER): Payer: 59 | Admitting: Internal Medicine

## 2017-09-12 ENCOUNTER — Encounter: Payer: Self-pay | Admitting: Internal Medicine

## 2017-09-12 VITALS — BP 119/76 | HR 59 | Temp 98.6°F | Resp 18 | Ht 69.0 in | Wt 183.0 lb

## 2017-09-12 DIAGNOSIS — K635 Polyp of colon: Secondary | ICD-10-CM

## 2017-09-12 DIAGNOSIS — Z86010 Personal history of colonic polyps: Secondary | ICD-10-CM

## 2017-09-12 DIAGNOSIS — D123 Benign neoplasm of transverse colon: Secondary | ICD-10-CM | POA: Diagnosis not present

## 2017-09-12 MED ORDER — SODIUM CHLORIDE 0.9 % IV SOLN: 500.0000 mL | INTRAVENOUS | Status: DC

## 2017-09-12 NOTE — Patient Instructions (Signed)
YOU HAD AN ENDOSCOPIC PROCEDURE TODAY AT THE [facility]:   Refer to the procedure report that was given to you for any specific questions about what was found during the examination.  If the procedure report does not answer your questions, please call your gastroenterologist to clarify.  If you requested that your care partner not be given the details of your procedure findings, then the procedure report has been included in a sealed envelope for you to review at your convenience later.  YOU SHOULD EXPECT: Some feelings of bloating in the abdomen. Passage of more gas than usual.  Walking can help get rid of the air that was put into your GI tract during the procedure and reduce the bloating. If you had a lower endoscopy (such as a colonoscopy or flexible sigmoidoscopy) you may notice spotting of blood in your stool or on the toilet paper. If you underwent a bowel prep for your procedure, you may not have a normal bowel movement for a few days.  Please Note:  You might notice some irritation and congestion in your nose or some drainage.  This is from the oxygen used during your procedure.  There is no need for concern and it should clear up in a day or so.  SYMPTOMS TO REPORT IMMEDIATELY:   Following lower endoscopy (colonoscopy or flexible sigmoidoscopy):  Excessive amounts of blood in the stool  Significant tenderness or worsening of abdominal pains  Swelling of the abdomen that is new, acute  Fever of 100F or higher    For urgent or emergent issues, a gastroenterologist can be reached at any hour by calling [phone].   DIET:  We do recommend a small meal at first, but then you may proceed to your regular diet.  Drink plenty of fluids but you should avoid alcoholic beverages for 24 hours.  ACTIVITY:  You should plan to take it easy for the rest of today and you should NOT DRIVE or use heavy machinery until tomorrow (because of the sedation medicines used during the test).     FOLLOW UP: Our staff will call the number listed on your records the next business day following your procedure to check on you and address any questions or concerns that you may have regarding the information given to you following your procedure. If we do not reach you, we will leave a message.  However, if you are feeling well and you are not experiencing any problems, there is no need to return our call.  We will assume that you have returned to your regular daily activities without incident.  If any biopsies were taken you will be contacted by phone or by letter within the next 1-3 weeks.  Please call us at [phone] if you have not heard about the biopsies in 3 weeks.    SIGNATURES/CONFIDENTIALITY: You and/or your care partner have signed paperwork which will be entered into your electronic medical record.  These signatures attest to the fact that that the information above on your After Visit Summary has been reviewed and is understood.  Full responsibility of the confidentiality of this discharge information lies with you and/or your care-partner.    Handouts were given to your care partner on polyps and diverticulosis. You may resume your current medications today. Await biopsy results. Please call if any questions or concerns.   

## 2017-09-12 NOTE — Progress Notes (Signed)
Called to room to assist during endoscopic procedure.  Patient ID and intended procedure confirmed with present staff. Received instructions for my participation in the procedure from the performing physician.  

## 2017-09-12 NOTE — Op Note (Signed)
[facility] Patient Name: Jerri Hargadon Procedure Date: 09/12/2017 9:26 AM MRN: [id number] Endoscopist: Docia Chuck. Henrene Pastor , MD Age: 66 Referring MD:  Date of Birth: Jul 13, 1951 Gender: Male Account #: [id number] Procedure:                Colonoscopy, with cold snare polypectomy x 1 Indications:              High risk colon cancer surveillance: Personal                            history of non-advanced adenomas. Previous exams                            2006 (negative) and 2013 (2 tubular adenomas) Medicines:                Monitored Anesthesia Care Procedure:                Pre-Anesthesia Assessment:                           - Prior to the procedure, a History and Physical                            was performed, and patient medications and                            allergies were reviewed. The patient's tolerance of                            previous anesthesia was also reviewed. The risks                            and benefits of the procedure and the sedation                            options and risks were discussed with the patient.                            All questions were answered, and informed consent                            was obtained. Prior Anticoagulants: The patient has                            taken no previous anticoagulant or antiplatelet                            agents. ASA Grade Assessment: II - A patient with                            mild systemic disease. After reviewing the risks                            and benefits, the patient was deemed in  satisfactory condition to undergo the procedure.                           After obtaining informed consent, the colonoscope                            was passed under direct vision. Throughout the                            procedure, the patient's blood pressure, pulse, and                            oxygen saturations were monitored continuously. The             Colonoscope was introduced through the anus and                            advanced to the the cecum, identified by                            appendiceal orifice and ileocecal valve. The                            ileocecal valve, appendiceal orifice, and rectum                            were photographed. The quality of the bowel                            preparation was excellent. The colonoscopy was                            performed without difficulty. The patient tolerated                            the procedure well. The bowel preparation used was                            SUPREP. Scope In: 9:37:44 AM Scope Out: 9:52:59 AM Scope Withdrawal Time: 0 hours 12 minutes 4 seconds  Total Procedure Duration: 0 hours 15 minutes 15 seconds  Findings:                 A 1 mm polyp was found in the transverse colon. The                            polyp was removed with a cold snare. Resection and                            retrieval were complete.                           Diverticula were found in the sigmoid colon.  The exam was otherwise without abnormality on                            direct and retroflexion views. Complications:            No immediate complications. Estimated blood loss:                            None. Estimated Blood Loss:     Estimated blood loss: none. Impression:               - One 1 mm polyp in the transverse colon, removed                            with a cold snare. Resected and retrieved.                           - Diverticulosis in the sigmoid colon.                           - The examination was otherwise normal on direct                            and retroflexion views. Recommendation:           - Repeat colonoscopy in 5 years for surveillance.                           - Patient has a contact number available for                            emergencies. The signs and symptoms of potential                             delayed complications were discussed with the                            patient. Return to normal activities tomorrow.                            Written discharge instructions were provided to the                            patient.                           - Resume previous diet.                           - Continue present medications.                           - Await pathology results. Docia Chuck. Henrene Pastor, MD 09/12/2017 9:57:20 AM This report has been signed electronically.

## 2017-09-12 NOTE — Progress Notes (Signed)
No problems noted in the recovery room. maw 

## 2017-09-12 NOTE — Progress Notes (Signed)
Pt's states no medical or surgical changes since previsit or office visit. 

## 2017-09-12 NOTE — Progress Notes (Signed)
To PACU, VSS. Report to [email or web address] 

## 2017-09-13 ENCOUNTER — Telehealth: Payer: Self-pay

## 2017-09-13 NOTE — Telephone Encounter (Signed)
  Follow up Call-  Call back number 09/12/2017  Post procedure Call Back phone  # [phone]  Permission to leave phone message Yes  Some recent data might be hidden     Patient questions:  Do you have a fever, pain , or abdominal swelling? No. Pain Score  0 *  Have you tolerated food without any problems? Yes.    Have you been able to return to your normal activities? Yes.    Do you have any questions about your discharge instructions: Diet   No. Medications  No. Follow up visit  No.  Do you have questions or concerns about your Care? No.  Actions: * If pain score is 4 or above: No action needed, pain <4.  No problems noted per pt. maw

## 2017-09-17 ENCOUNTER — Encounter: Payer: Self-pay | Admitting: Internal Medicine

## 2017-09-20 DIAGNOSIS — Z23 Encounter for immunization: Secondary | ICD-10-CM | POA: Diagnosis not present

## 2018-02-05 DIAGNOSIS — H35033 Hypertensive retinopathy, bilateral: Secondary | ICD-10-CM | POA: Diagnosis not present

## 2018-02-05 DIAGNOSIS — H524 Presbyopia: Secondary | ICD-10-CM | POA: Diagnosis not present

## 2018-04-09 DIAGNOSIS — R82998 Other abnormal findings in urine: Secondary | ICD-10-CM | POA: Diagnosis not present

## 2018-04-09 DIAGNOSIS — I10 Essential (primary) hypertension: Secondary | ICD-10-CM | POA: Diagnosis not present

## 2018-04-09 DIAGNOSIS — E7849 Other hyperlipidemia: Secondary | ICD-10-CM | POA: Diagnosis not present

## 2018-04-09 DIAGNOSIS — Z125 Encounter for screening for malignant neoplasm of prostate: Secondary | ICD-10-CM | POA: Diagnosis not present

## 2018-04-16 DIAGNOSIS — Z9861 Coronary angioplasty status: Secondary | ICD-10-CM | POA: Diagnosis not present

## 2018-04-16 DIAGNOSIS — Z23 Encounter for immunization: Secondary | ICD-10-CM | POA: Diagnosis not present

## 2018-04-16 DIAGNOSIS — Z86010 Personal history of colonic polyps: Secondary | ICD-10-CM | POA: Diagnosis not present

## 2018-04-16 DIAGNOSIS — Z0000 Encounter for general adult medical examination without abnormal findings: Secondary | ICD-10-CM | POA: Diagnosis not present

## 2018-04-16 DIAGNOSIS — E7849 Other hyperlipidemia: Secondary | ICD-10-CM | POA: Diagnosis not present

## 2018-04-16 DIAGNOSIS — I252 Old myocardial infarction: Secondary | ICD-10-CM | POA: Diagnosis not present

## 2018-04-16 DIAGNOSIS — I10 Essential (primary) hypertension: Secondary | ICD-10-CM | POA: Diagnosis not present

## 2018-04-16 DIAGNOSIS — D3500 Benign neoplasm of unspecified adrenal gland: Secondary | ICD-10-CM | POA: Diagnosis not present

## 2018-04-16 DIAGNOSIS — Z1389 Encounter for screening for other disorder: Secondary | ICD-10-CM | POA: Diagnosis not present

## 2018-04-16 DIAGNOSIS — Z8249 Family history of ischemic heart disease and other diseases of the circulatory system: Secondary | ICD-10-CM | POA: Diagnosis not present

## 2018-04-19 DIAGNOSIS — Z1212 Encounter for screening for malignant neoplasm of rectum: Secondary | ICD-10-CM | POA: Diagnosis not present

## 2018-05-21 DIAGNOSIS — D1801 Hemangioma of skin and subcutaneous tissue: Secondary | ICD-10-CM | POA: Diagnosis not present

## 2018-05-21 DIAGNOSIS — L821 Other seborrheic keratosis: Secondary | ICD-10-CM | POA: Diagnosis not present

## 2018-05-21 DIAGNOSIS — L820 Inflamed seborrheic keratosis: Secondary | ICD-10-CM | POA: Diagnosis not present

## 2018-05-21 DIAGNOSIS — Z85828 Personal history of other malignant neoplasm of skin: Secondary | ICD-10-CM | POA: Diagnosis not present

## 2018-05-21 DIAGNOSIS — L812 Freckles: Secondary | ICD-10-CM | POA: Diagnosis not present

## 2018-05-21 DIAGNOSIS — D225 Melanocytic nevi of trunk: Secondary | ICD-10-CM | POA: Diagnosis not present

## 2018-06-24 DIAGNOSIS — E7849 Other hyperlipidemia: Secondary | ICD-10-CM | POA: Diagnosis not present

## 2018-07-03 ENCOUNTER — Encounter: Payer: Self-pay | Admitting: Internal Medicine

## 2018-07-03 ENCOUNTER — Ambulatory Visit (INDEPENDENT_AMBULATORY_CARE_PROVIDER_SITE_OTHER): Payer: Medicare HMO | Admitting: Internal Medicine

## 2018-07-03 VITALS — BP 124/78 | HR 66 | Ht 69.0 in | Wt 184.0 lb

## 2018-07-03 DIAGNOSIS — E785 Hyperlipidemia, unspecified: Secondary | ICD-10-CM | POA: Diagnosis not present

## 2018-07-03 DIAGNOSIS — I10 Essential (primary) hypertension: Secondary | ICD-10-CM

## 2018-07-03 DIAGNOSIS — I2510 Atherosclerotic heart disease of native coronary artery without angina pectoris: Secondary | ICD-10-CM | POA: Diagnosis not present

## 2018-07-03 DIAGNOSIS — Z9861 Coronary angioplasty status: Secondary | ICD-10-CM | POA: Diagnosis not present

## 2018-07-03 NOTE — Patient Instructions (Signed)
Your physician wants you to follow-up in: ONE YEAR with Dr. Hilty. You will receive a reminder letter in the mail two months in advance. If you don't receive a letter, please call our office to schedule the follow-up appointment.  

## 2018-07-03 NOTE — Progress Notes (Signed)
OFFICE NOTE  Chief Complaint:  No complaints  Primary Care Physician: Marton Redwood, MD  HPI:  Calvin Patterson  is a 67 year old gentleman, who I have been following for a history of a non-ST elevation MI in January 2013. He underwent cardiac catheterization, which showed a high grade circumflex lesion, underwent cutting balloon atherectomy with marked improvement of his symptoms. He was on aspirin and Brilinta following that; however, in April, developed a hyphema or perhaps some other type of bleeding disorder to the eye, which he then discontinued his Brilinta. He has done fine since that time and is active, walking two to three miles, one to three times a week without any symptoms. Overall denies any chest pain, shortness of breath, palpitations, presyncope, or syncopal symptoms. His only concerns have to do with muscle aches and pains which he partially attributes to his statin medications.  Calvin Patterson returns today and is feeling quite well. He remains active and denies any shortness of breath or chest pain. In January he underwent a lipid NMR which demonstrated a LDL particle number of 1295, and LDL content of 106. He has since been more regularly taking Crestor 5 mg for 2 days and then off for one day. This seems to work at minimizing his myalgias. Otherwise he has no complaints.   Calvin Patterson returns today for follow-up.  Overall he reports doing really well. He has good energy level and no chest pain or worsening shortness of breath. Recently had lab work done through his primary care provider which indicated a total cholesterol 184, triglycerides 117, HDL 62 and LDL 99. Marked improvement however he has been a little intolerant to the Crestor. Currently he is taking 5 mg every other day. In addition he was recently started on Zetia which I think will be additionally helpful to reach his cholesterol. He is on low-dose lisinopril and blood pressure is low to normal but otherwise well  controlled. Energy level is good and he remains active.  06/16/2016  Calvin Patterson was seen in follow-up today in the office. He seems to be doing well. He recently went on a hiking and fishing trip in [place] and was asymptomatic with that. He exercises regularly. Recent cholesterol still shows LDL in the 90s. He's been intolerant of Crestor and can take it very infrequently only. He is on Zetia. He's very care provider was talking to him about possibly adding a PC SK 9 inhibitor.  07/03/2017  Calvin Patterson returns today for follow-up. Overall he seems to be doing well. Denies any chest pain or worsening shortness of breath. He is physically active. He reports a healthy diet. His blood pressures been well controlled and is at goal today. Recently I reviewed lab work from his primary care provider, which shows his total cholesterol 146, triglycerides 102, HL-C 57 and LDL-C 69. This is finally at goal on ezetimibe 10 mg and rosuvastatin 5 mg every third day. He reported when taking rosuvastatin every other day he had some myalgias. This regimen seems to be tolerable to him and is getting him to goal.  07/03/2018  Calvin Patterson was seen today for annual follow-up.  Over the past year he has been very stable.  He denies any new chest pain or worsening shortness of breath.  Weight is about 1 pound heavier than it was previously.  Blood pressure is well controlled 124/78.  He reports compliance with his medications including ezetimibe and rosuvastatin 5 mg which he takes every third day.  Unfortunately he is not been able to tolerate any more frequent therapy.  He does have a history of statin intolerance with myalgias on both simvastatin, atorvastatin and pitavastatin.  His LDL previously was 69 however recently increased to 94.  Likely related to dietary changes.  We discussed the possibility of him going on additional therapy with PCSK9 inhibitor versus increasing the dose of his rosuvastatin.  He said he would be willing  to try a more statin however is concerned about side effects.  PMHx:  Past Medical History:  Diagnosis Date  . Angina   . Arthritis   . Coronary artery disease   . Diverticulosis   . 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 Dyslipidemia 11/30/2011  . HTN (hypertension) 11/30/2011  . Hypercholesteremia   . Hypertension   . NSTEMI (non-ST elevated myocardial infarction), 11/30/11 11/30/2011  . S/P percutaneous transluminal coronary angioplasty, with cutting baloon, av groove of LCX, 11/30/11 11/30/2011    Past Surgical History:  Procedure Laterality Date  . APPENDECTOMY  1962  . CARDIAC CATHETERIZATION  11/30/2011   high grade stable lesion in left Cfx (Dr. Jerilynn Mages. Croitoru) - atherectomyy & angioplasty of AV groove Cfx with Flexetome Cutting Balloon by Dr. Roni Bread  . LEFT HEART CATHETERIZATION WITH CORONARY ANGIOGRAM N/A 11/30/2011   Procedure: LEFT HEART CATHETERIZATION WITH CORONARY ANGIOGRAM;  Surgeon: Sanda Klein, MD;  Location: [facility] CATH [facility];  Service: Cardiovascular;  Laterality: N/A;  . PERCUTANEOUS CORONARY INTERVENTION-BALLOON ONLY  11/30/2011   Procedure: PERCUTANEOUS CORONARY INTERVENTION-BALLOON ONLY;  Surgeon: Sanda Klein, MD;  Location: [facility]ATH [facility];  Service: Cardiovascular;;  . TONSILLECTOMY  1958  . WISDOM TOOTH EXTRACTION  1974    FAMHx:  Family History  Problem Relation Age of Onset  . Coronary artery disease Father   . Heart disease Father   . Lung cancer Father   . Heart failure Mother   . Dementia Mother   . Heart disease Paternal Grandmother   . Heart disease Paternal Grandfather   . Colon cancer Neg Hx   . Stomach cancer Neg Hx     SOCHx:   reports that he has never smoked. He has never used smokeless tobacco. He reports that he drinks about 4.2 oz of alcohol per week. He reports that he does not use drugs.  ALLERGIES:  Allergies  Allergen Reactions  . Statins     Muscle aches    ROS: A comprehensive review of systems was  negative.  HOME MEDS: Current Outpatient Medications  Medication Sig Dispense Refill  . aspirin 81 MG tablet Take 81 mg by mouth daily.    . Coenzyme Q10 (EQL COQ10) 300 MG CAPS Take 1 capsule by mouth daily.    Marland Kitchen ezetimibe (ZETIA) 10 MG tablet Take 10 mg by mouth daily.    Marland Kitchen lisinopril (PRINIVIL,ZESTRIL) 10 MG tablet Take 10 mg by mouth daily.  3  . Multiple Vitamins-Minerals (MULTIVITAMINS THER. W/MINERALS) TABS Take 1 tablet by mouth daily.      . rosuvastatin (CRESTOR) 5 MG tablet Take 5 mg by mouth daily. Takes every 3rd day     No current facility-administered medications for this visit.     LABS/IMAGING: No results found for this or any previous visit (from the past 48 hour(s)). No results found.  VITALS: BP 124/78   Pulse 66   Ht 5\' 9"  (1.753 m)   Wt 184 lb (83.5 kg)   BMI 27.17 kg/m   EXAM: General appearance: alert and no  distress Neck: no adenopathy, no carotid bruit, no JVD, supple, symmetrical, trachea midline and thyroid not enlarged, symmetric, no tenderness/mass/nodules Lungs: clear to auscultation bilaterally Heart: regular rate and rhythm, S1, S2 normal, no murmur, click, rub or gallop Abdomen: soft, non-tender; bowel sounds normal; no masses,  no organomegaly Extremities: extremities normal, atraumatic, no cyanosis or edema Pulses: 2+ and symmetric Skin: Skin color, texture, turgor normal. No rashes or lesions Neurologic: Grossly normal  EKG: Normal sinus rhythm at 66 personally reviewed  ASSESSMENT: 1. Coronary artery disease status post non-ST elevation MI with cutting balloon atherectomy to the AV circumflex (2013) 2. Hypertension 3. Dyslipidemia  PLAN: 1.   Mr. Bomberger has stable coronary artery disease without recurrent symptoms after an STEMI and Cutting Balloon atherectomy to the circumflex in 2013.  He does have dyslipidemia with LDL which has hovered around 70 but elevated LDL particle numbers of been noted in the past.  He reports compliance  with low-dose rosuvastatin which he takes infrequently.  He is willing to try to take more medication for additional benefit and make dietary changes.  We discussed the possibility of starting PCSK9 inhibitor therapy since he is by definition statin intolerant, however he is not interested at this time.  Plan follow-up with me annually or sooner as necessary.  Pixie Casino, MD, [facility], [place] Director of the Advanced Lipid Disorders &  Cardiovascular Risk Reduction Clinic Diplomate of the American Board of Clinical Lipidology Attending Cardiologist  Direct Dial: [phone]  Fax: [phone]  Website:  [email or web address] 07/03/2018, 9:05 AM

## 2018-08-06 DIAGNOSIS — Z23 Encounter for immunization: Secondary | ICD-10-CM | POA: Diagnosis not present

## 2018-12-02 DIAGNOSIS — L812 Freckles: Secondary | ICD-10-CM | POA: Diagnosis not present

## 2018-12-02 DIAGNOSIS — L578 Other skin changes due to chronic exposure to nonionizing radiation: Secondary | ICD-10-CM | POA: Diagnosis not present

## 2018-12-02 DIAGNOSIS — D225 Melanocytic nevi of trunk: Secondary | ICD-10-CM | POA: Diagnosis not present

## 2018-12-02 DIAGNOSIS — Z85828 Personal history of other malignant neoplasm of skin: Secondary | ICD-10-CM | POA: Diagnosis not present

## 2018-12-02 DIAGNOSIS — D1801 Hemangioma of skin and subcutaneous tissue: Secondary | ICD-10-CM | POA: Diagnosis not present

## 2018-12-02 DIAGNOSIS — L821 Other seborrheic keratosis: Secondary | ICD-10-CM | POA: Diagnosis not present

## 2018-12-02 DIAGNOSIS — L570 Actinic keratosis: Secondary | ICD-10-CM | POA: Diagnosis not present

## 2019-04-22 DIAGNOSIS — Z125 Encounter for screening for malignant neoplasm of prostate: Secondary | ICD-10-CM | POA: Diagnosis not present

## 2019-04-22 DIAGNOSIS — E7849 Other hyperlipidemia: Secondary | ICD-10-CM | POA: Diagnosis not present

## 2019-04-22 DIAGNOSIS — I10 Essential (primary) hypertension: Secondary | ICD-10-CM | POA: Diagnosis not present

## 2019-04-23 DIAGNOSIS — H35033 Hypertensive retinopathy, bilateral: Secondary | ICD-10-CM | POA: Diagnosis not present

## 2019-04-23 DIAGNOSIS — R82998 Other abnormal findings in urine: Secondary | ICD-10-CM | POA: Diagnosis not present

## 2019-04-23 DIAGNOSIS — H524 Presbyopia: Secondary | ICD-10-CM | POA: Diagnosis not present

## 2019-04-28 DIAGNOSIS — Z1339 Encounter for screening examination for other mental health and behavioral disorders: Secondary | ICD-10-CM | POA: Diagnosis not present

## 2019-04-28 DIAGNOSIS — Z0000 Encounter for general adult medical examination without abnormal findings: Secondary | ICD-10-CM | POA: Diagnosis not present

## 2019-04-28 DIAGNOSIS — M25552 Pain in left hip: Secondary | ICD-10-CM | POA: Diagnosis not present

## 2019-04-28 DIAGNOSIS — Z86010 Personal history of colonic polyps: Secondary | ICD-10-CM | POA: Diagnosis not present

## 2019-04-28 DIAGNOSIS — Z9861 Coronary angioplasty status: Secondary | ICD-10-CM | POA: Diagnosis not present

## 2019-04-28 DIAGNOSIS — I252 Old myocardial infarction: Secondary | ICD-10-CM | POA: Diagnosis not present

## 2019-04-28 DIAGNOSIS — E785 Hyperlipidemia, unspecified: Secondary | ICD-10-CM | POA: Diagnosis not present

## 2019-04-28 DIAGNOSIS — I10 Essential (primary) hypertension: Secondary | ICD-10-CM | POA: Diagnosis not present

## 2019-04-28 DIAGNOSIS — D692 Other nonthrombocytopenic purpura: Secondary | ICD-10-CM | POA: Diagnosis not present

## 2019-04-28 DIAGNOSIS — Z1331 Encounter for screening for depression: Secondary | ICD-10-CM | POA: Diagnosis not present

## 2019-05-26 ENCOUNTER — Other Ambulatory Visit: Payer: Self-pay

## 2019-05-26 ENCOUNTER — Ambulatory Visit (INDEPENDENT_AMBULATORY_CARE_PROVIDER_SITE_OTHER): Payer: Medicare HMO | Admitting: Orthopaedic Surgery

## 2019-05-26 ENCOUNTER — Ambulatory Visit (INDEPENDENT_AMBULATORY_CARE_PROVIDER_SITE_OTHER): Payer: Medicare HMO

## 2019-05-26 DIAGNOSIS — M25552 Pain in left hip: Secondary | ICD-10-CM

## 2019-05-26 NOTE — Progress Notes (Signed)
Office Visit Note   Patient: Calvin Patterson           Date of Birth: 1951/09/03           MRN: [id number] Visit Date: 05/26/2019              Requested by: Marton Redwood, MD [address] [place],  [place] [address] PCP: Marton Redwood, MD   Assessment & Plan: Visit Diagnoses:  1. Pain in left hip     Plan: I gave him reassurance that I do not think this is a hip issue but it may be a lower back and SI joint issue.  I would like to send him to outpatient physical therapy to see if they can take a look at him and try some modalities to decrease his pain and improve his low back and hip function.  I would like to see him back in 3 weeks and if he still symptomatic I would like a standing AP and lateral lumbar spine.  All question concerns were answered and addressed.  Follow-Up Instructions: Return in about 3 weeks (around 06/16/2019).   Orders:  Orders Placed This Encounter  Procedures  . XR HIP UNILAT W OR W/O PELVIS 1V LEFT   No orders of the defined types were placed in this encounter.     Procedures: No procedures performed   Clinical Data: No additional findings.   Subjective: Chief Complaint  Patient presents with  . Left Hip - Pain  Patient is a very pleasant 68 year old active gentleman who is been having pain with describes in his left hip for several years now.  He says is about 5 years now with a dull ache that becomes pretty constant special with activities and he does not feel like he can ignore it anymore.  He said years ago when he was at a young age he was painting and fell from a ladder landing on that hip and it did hurt him for about 3 days.  He points to his low back and posterior pelvis and some his groin as a source of the pain the left side.  He denies any weakness in his legs and denies any numbness tingling in his feet.  He is a thin individual and is not a smoker.  He is not a diabetic.  He takes Advil on occasion for this.  HPI  Review of Systems  He currently denies any headache, chest pain, shortness of breath, fever, chills, nausea, vomiting  Objective: Vital Signs: There were no vitals taken for this visit.  Physical Exam Is alert and oriented x3 and in no acute distress Ortho Exam Examination of both his hips shows fluid and full range of motion of both hips with no pain in the groin at all and no blocks to rotation.  He has some pain in the posterior pelvis and sciatic region as well as the side of his hip but not true enteric bursitis.  He has 5 out of 5 strength in his bilateral lower extremities and normal sensation all dermatomes. Specialty Comments:  No specialty comments available.  Imaging: Xr Hip Unilat W Or W/o Pelvis 1v Left  Result Date: 05/26/2019 An AP pelvis and lateral left hip shows no acute findings.  Both hips still have a well-maintained joint space with no significant arthritic findings.    PMFS History: Patient Active Problem List   Diagnosis Date Noted  . S/P percutaneous transluminal coronary angioplasty, with cutting baloon, av groove of  LCX, 11/30/11 11/30/2011  . HTN (hypertension) 11/30/2011  . Dyslipidemia 11/30/2011  . Drug-induced bradycardia, and hypotension with NTG 11/30/2011   Past Medical History:  Diagnosis Date  . Angina   . Arthritis   . Coronary artery disease   . Diverticulosis   . 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 Dyslipidemia 11/30/2011  . HTN (hypertension) 11/30/2011  . Hypercholesteremia   . Hypertension   . NSTEMI (non-ST elevated myocardial infarction), 11/30/11 11/30/2011  . S/P percutaneous transluminal coronary angioplasty, with cutting baloon, av groove of LCX, 11/30/11 11/30/2011    Family History  Problem Relation Age of Onset  . Coronary artery disease Father   . Heart disease Father   . Lung cancer Father   . Heart failure Mother   . Dementia Mother   . Heart disease Paternal Grandmother   . Heart disease Paternal Grandfather   .  Colon cancer Neg Hx   . Stomach cancer Neg Hx     Past Surgical History:  Procedure Laterality Date  . APPENDECTOMY  1962  . CARDIAC CATHETERIZATION  11/30/2011   high grade stable lesion in left Cfx (Dr. Jerilynn Mages. Croitoru) - atherectomyy & angioplasty of AV groove Cfx with Flexetome Cutting Balloon by Dr. Roni Bread  . LEFT HEART CATHETERIZATION WITH CORONARY ANGIOGRAM N/A 11/30/2011   Procedure: LEFT HEART CATHETERIZATION WITH CORONARY ANGIOGRAM;  Surgeon: Sanda Klein, MD;  Location: [facility] CATH LAB;  Service: Cardiovascular;  Laterality: N/A;  . PERCUTANEOUS CORONARY INTERVENTION-BALLOON ONLY  11/30/2011   Procedure: PERCUTANEOUS CORONARY INTERVENTION-BALLOON ONLY;  Surgeon: Sanda Klein, MD;  Location: [facility] CATH LAB;  Service: Cardiovascular;;  . TONSILLECTOMY  1958  . [place] EXTRACTION  1974   Social History   Occupational History    Employer: [employer]  Tobacco Use  . Smoking status: Never Smoker  . Smokeless tobacco: Never Used  Substance and Sexual Activity  . Alcohol use: Yes    Alcohol/week: 7.0 standard drinks    Types: 7 Glasses of wine per week  . Drug use: No  . Sexual activity: Yes

## 2019-06-02 DIAGNOSIS — M6281 Muscle weakness (generalized): Secondary | ICD-10-CM | POA: Diagnosis not present

## 2019-06-02 DIAGNOSIS — M25552 Pain in left hip: Secondary | ICD-10-CM | POA: Diagnosis not present

## 2019-06-02 DIAGNOSIS — M545 Low back pain: Secondary | ICD-10-CM | POA: Diagnosis not present

## 2019-06-05 DIAGNOSIS — M545 Low back pain: Secondary | ICD-10-CM | POA: Diagnosis not present

## 2019-06-05 DIAGNOSIS — M6281 Muscle weakness (generalized): Secondary | ICD-10-CM | POA: Diagnosis not present

## 2019-06-05 DIAGNOSIS — M25552 Pain in left hip: Secondary | ICD-10-CM | POA: Diagnosis not present

## 2019-06-11 DIAGNOSIS — M25552 Pain in left hip: Secondary | ICD-10-CM | POA: Diagnosis not present

## 2019-06-11 DIAGNOSIS — M545 Low back pain: Secondary | ICD-10-CM | POA: Diagnosis not present

## 2019-06-11 DIAGNOSIS — M6281 Muscle weakness (generalized): Secondary | ICD-10-CM | POA: Diagnosis not present

## 2019-06-23 ENCOUNTER — Ambulatory Visit (INDEPENDENT_AMBULATORY_CARE_PROVIDER_SITE_OTHER): Payer: Medicare HMO | Admitting: Orthopaedic Surgery

## 2019-06-23 ENCOUNTER — Encounter: Payer: Self-pay | Admitting: Orthopaedic Surgery

## 2019-06-23 ENCOUNTER — Other Ambulatory Visit: Payer: Self-pay

## 2019-06-23 ENCOUNTER — Ambulatory Visit (INDEPENDENT_AMBULATORY_CARE_PROVIDER_SITE_OTHER): Payer: Medicare HMO

## 2019-06-23 DIAGNOSIS — M4807 Spinal stenosis, lumbosacral region: Secondary | ICD-10-CM

## 2019-06-23 DIAGNOSIS — M6259 Muscle wasting and atrophy, not elsewhere classified, multiple sites: Secondary | ICD-10-CM | POA: Diagnosis not present

## 2019-06-23 DIAGNOSIS — M5432 Sciatica, left side: Secondary | ICD-10-CM | POA: Diagnosis not present

## 2019-06-23 DIAGNOSIS — M25552 Pain in left hip: Secondary | ICD-10-CM | POA: Diagnosis not present

## 2019-06-23 NOTE — Progress Notes (Signed)
The patient is coming in today after being through physical therapy to work on his lumbar spine due to left-sided low back pain and sciatica.  The therapy is certainly help but he still having problems with what he describes as hip pain.  He denies any groin pain but he points to the posterior pelvis and the left side is source of his pain.  It does radiate to his knee.  He denies any change in bowel bladder function.  On exam his left hip moves fluidly and smoothly with no pain in the groin at all.  He still has pain with flexion extension to the left side of his lumbar spine and he does have a positive straight leg raise on the left side.  There is slight sensory deficit on the left side of his left leg.  X-rays of the lumbar spine showed no gross abnormalities or acute findings but there is degenerative disc disease and arthritic findings throughout the lumbar spine.  Given his continued sciatic symptoms we will have him continue physical therapy but an MRI at this point is warranted of the lumbar spine to rule out herniated disc.  We will see him back after this MRI is obtained.  All question concerns were answered and addressed.

## 2019-06-25 DIAGNOSIS — M25552 Pain in left hip: Secondary | ICD-10-CM | POA: Diagnosis not present

## 2019-06-25 DIAGNOSIS — M6281 Muscle weakness (generalized): Secondary | ICD-10-CM | POA: Diagnosis not present

## 2019-06-25 DIAGNOSIS — M545 Low back pain: Secondary | ICD-10-CM | POA: Diagnosis not present

## 2019-07-07 ENCOUNTER — Ambulatory Visit: Payer: Medicare HMO | Admitting: Orthopaedic Surgery

## 2019-07-15 ENCOUNTER — Telehealth (INDEPENDENT_AMBULATORY_CARE_PROVIDER_SITE_OTHER): Payer: Medicare HMO | Admitting: Internal Medicine

## 2019-07-15 VITALS — BP 124/65 | HR 62 | Ht 69.0 in | Wt 178.0 lb

## 2019-07-15 DIAGNOSIS — Z9861 Coronary angioplasty status: Secondary | ICD-10-CM

## 2019-07-15 DIAGNOSIS — E785 Hyperlipidemia, unspecified: Secondary | ICD-10-CM | POA: Diagnosis not present

## 2019-07-15 DIAGNOSIS — I2510 Atherosclerotic heart disease of native coronary artery without angina pectoris: Secondary | ICD-10-CM

## 2019-07-15 DIAGNOSIS — I10 Essential (primary) hypertension: Secondary | ICD-10-CM | POA: Diagnosis not present

## 2019-07-15 MED ORDER — REPATHA SURECLICK 140 MG/ML ~~LOC~~ SOAJ: 1.0000 "pen " | SUBCUTANEOUS | Status: DC

## 2019-07-15 NOTE — Patient Instructions (Addendum)
Medication Instructions:  START REPATHA INJECTION-SOMEONE WILL BE CALLING YOU TO DISCUSS THIS If you need a refill on your cardiac medications before your next appointment, please call your pharmacy.  At [facility], you and your health needs are our priority.  As part of our continuing mission to provide you with exceptional heart care, we have created designated Provider Care Teams.  These Care Teams include your primary Cardiologist (physician) and Advanced Practice Providers (APPs -  Physician Assistants and Nurse Practitioners) who all work together to provide you with the care you need, when you need it.  Thank you for choosing [facility] at [facility]!!

## 2019-07-15 NOTE — Progress Notes (Signed)
Virtual Visit via Video Note   This visit type was conducted due to national recommendations for restrictions regarding the COVID-19 Pandemic (e.g. social distancing) in an effort to limit this patient's exposure and mitigate transmission in our community.  Due to his co-morbid illnesses, this patient is at least at moderate risk for complications without adequate follow up.  This format is felt to be most appropriate for this patient at this time.  All issues noted in this document were discussed and addressed.  A limited physical exam was performed with this format.  Please refer to the patient's chart for his consent to telehealth for [facility].   Evaluation Performed:  Doxy.me video  Date:  07/15/2019   ID:  Calvin Patterson, DOB 1951-06-03, MRN [id number]  Patient Location:  [address] [place] [address]  Provider location:   [address] [place], [place] [address]  PCP:  Calvin Redwood, MD  Cardiologist:  Calvin Casino, MD Electrophysiologist:  None   Chief Complaint:  No complaints  History of Present Illness:    Calvin Patterson is a 68 y.o. male who presents via audio/video conferencing for a telehealth visit today.  Calvin Patterson was seen today for video follow-up.  Overall he continues to do well.  He denies any further chest pain or worsening shortness of breath.  He had some labs sent in May this year which showed a persistently mildly elevated LDL cholesterol 95 with goal LDL less than 70.  We previously discussed this as he currently is on max tolerated statin which is rosuvastatin every 3 days 5 mg, but even with this he says he gets some myalgias.  He also takes ezetimibe.  We have previously discussed starting Repatha however he was hesitant to do that.  Recently discussed it with his primary care provider and seem to be more comfortable with it.  He has not yet been started on the medication.  The patient does not have symptoms concerning for  COVID-19 infection (fever, chills, cough, or new SHORTNESS OF BREATH).    Prior CV studies:   The following studies were reviewed today:  Chart reviewed  PMHx:  Past Medical History:  Diagnosis Date   Angina    Arthritis    Coronary artery disease    Diverticulosis    Drug-induced bradycardia, and hypotension with NTG 11/30/2011   Dyslipidemia 11/30/2011   HTN (hypertension) 11/30/2011   Hypercholesteremia    Hypertension    NSTEMI (non-ST elevated myocardial infarction), 11/30/11 11/30/2011   S/P percutaneous transluminal coronary angioplasty, with cutting baloon, av groove of LCX, 11/30/11 11/30/2011    Past Surgical History:  Procedure Laterality Date   [place]  11/30/2011   high grade stable lesion in left Cfx (Dr. Jerilynn Mages. Patterson) - atherectomyy & angioplasty of AV groove Cfx with Flexetome Cutting Balloon by Dr. Roni Patterson   LEFT HEART CATHETERIZATION WITH CORONARY ANGIOGRAM N/A 11/30/2011   Procedure: [place];  Surgeon: Calvin Klein, MD;  Location: [facility] CATH LAB;  Service: Cardiovascular;  Laterality: N/A;   PERCUTANEOUS CORONARY INTERVENTION-BALLOON ONLY  11/30/2011   Procedure: PERCUTANEOUS CORONARY INTERVENTION-BALLOON ONLY;  Surgeon: Calvin Klein, MD;  Location: [facility] CATH LAB;  Service: Cardiovascular;;   TONSILLECTOMY  1958   WISDOM TOOTH EXTRACTION  1974    FAMHx:  Family History  Problem Relation Age of Onset   Coronary artery disease Father    Heart disease Father  Lung cancer Father    Heart failure Mother    Dementia Mother    Heart disease Paternal Grandmother    Heart disease Paternal Grandfather    Colon cancer Neg Hx    Stomach cancer Neg Hx     SOCHx:   reports that he has never smoked. He has never used smokeless tobacco. He reports current alcohol use of about 7.0 standard drinks of alcohol per week. He reports that he does not use drugs.  ALLERGIES:    Allergies  Allergen Reactions   Statins     Muscle aches    MEDS:  Current Meds  Medication Sig   aspirin 81 MG tablet Take 81 mg by mouth daily.   Coenzyme Q10 (EQL COQ10) 300 MG CAPS Take 1 capsule by mouth daily.   ezetimibe (ZETIA) 10 MG tablet Take 10 mg by mouth daily.   lisinopril (PRINIVIL,ZESTRIL) 10 MG tablet Take 10 mg by mouth daily.   Multiple Vitamins-Minerals (MULTIVITAMINS THER. W/MINERALS) TABS Take 1 tablet by mouth daily.     rosuvastatin (CRESTOR) 5 MG tablet Take 5 mg by mouth daily. Takes every 3rd day     ROS: Pertinent items noted in HPI and remainder of comprehensive ROS otherwise negative.  Labs/Other Tests and Data Reviewed:    Recent Labs: No results found for requested labs within last 8760 hours.   Recent Lipid Panel Lab Results  Component Value Date/Time   CHOL 179 12/11/2013 09:05 AM   TRIG 120 12/11/2013 09:05 AM   HDL 53 12/11/2013 09:05 AM   LDLCALC 102 (H) 12/11/2013 09:05 AM    Wt Readings from Last 3 Encounters:  07/15/19 178 lb (80.7 kg)  07/03/18 184 lb (83.5 kg)  09/12/17 183 lb (83 kg)     Exam:    Vital Signs:  BP 124/65 (BP Location: Left Arm, Patient Position: Sitting)    Pulse 62    Ht 5\' 9"  (1.753 m)    Wt 178 lb (80.7 kg)    BMI 26.29 kg/m    General appearance: alert and no distress Lungs: no audible wheezing Abdomen: scapoid Extremities: extremities normal, atraumatic, no cyanosis or edema Skin: Skin color, texture, turgor normal. No rashes or lesions Neurologic: Mental status: Alert, oriented, thought content appropriate PSych: pleasant  ASSESSMENT & PLAN:    1. Coronary artery disease status post non-ST elevation MI with cutting balloon atherectomy to the AV circumflex (2013) 2. Hypertension 3. Dyslipidemia  [name] continues to do well without any recurrent angina.  His blood pressure is well controlled.  Cholesterol remains above goal LDL less than 70.  He is on max tolerated rosuvastatin 5 mg  every 3 days.  He had previously tried it every day and every other day and does not even think he can really tolerate every 3 days.  He also takes ezetimibe.  He is a good candidate to add Repatha to reach target less than 70 based on his ASCVD, hypertension and risk factors including age.  We will start prior authorization for Repatha 140 mg every 2 weeks.  Ultimately, we may have to consider stopping his rosuvastatin due to side effects.  Plan repeat lab in 3 to 4 months.  Follow-up with me in 6 months.  COVID-19 Education: The signs and symptoms of COVID-19 were discussed with the patient and how to seek care for testing (follow up with PCP or arrange E-visit).  The importance of social distancing was discussed today.  Patient Risk:  After full review of this patients clinical status, I feel that they are at least moderate risk at this time.  Time:   Today, I have spent 25 minutes with the patient with telehealth technology discussing dyslipidemia, coronary disease, hypertension, PCSK9 inhibitors.     Medication Adjustments/Labs and Tests Ordered: Current medicines are reviewed at length with the patient today.  Concerns regarding medicines are outlined above.   Tests Ordered: No orders of the defined types were placed in this encounter.   Medication Changes: Meds ordered this encounter  Medications   Evolocumab (REPATHA SURECLICK) 594 MG/ML SOAJ    Sig: Inject 1 pen into the skin every 14 (fourteen) days.    Disposition:  in 6 month(s)  Calvin Casino, MD, [facility], [place] Director of the Advanced Lipid Disorders &  Cardiovascular Risk Reduction Clinic Diplomate of the American Board of Clinical Lipidology Attending Cardiologist  Direct Dial: [phone]   Fax: [phone]  Website:  [email or web address]  Calvin Casino, MD  07/15/2019 4:42 PM

## 2019-07-17 ENCOUNTER — Telehealth: Payer: Self-pay | Admitting: Internal Medicine

## 2019-07-17 NOTE — Telephone Encounter (Signed)
PA for repatha sureclick 140mg /mL once every 14 days submitted via [email or web address]  [facility] (Key: [id number])  ID [id number]  BIN [id number]  PCN [id number]  RxGrp [id number]

## 2019-07-21 ENCOUNTER — Telehealth: Payer: Self-pay | Admitting: Internal Medicine

## 2019-07-21 MED ORDER — PRALUENT 150 MG/ML ~~LOC~~ SOAJ: 1.0000 | SUBCUTANEOUS | 11 refills | Status: DC

## 2019-07-21 NOTE — Telephone Encounter (Signed)
Formulary alternative of PRALUENT is preferred. Routed to MD to OK this change and advise on dose.

## 2019-07-21 NOTE — Telephone Encounter (Signed)
Follow Up:   Pt calling, said you had been talking about his Repatha.;

## 2019-07-21 NOTE — Telephone Encounter (Signed)
Patient aware Repatha denied and Praluent is preferred. Per MD, OK to change to Praluent 150mg /mL q2weeks. Rx(s) sent to pharmacy electronically.

## 2019-07-21 NOTE — Telephone Encounter (Signed)
PA for Praluent 150mg /mL q2 weeks submitted via [email or web address]  Key: [id number] - PA Case ID: [id number] - Rx #: [id number]

## 2019-07-21 NOTE — Telephone Encounter (Signed)
Ok to changed to Praluent 150 mg q2weeks.  Dr. Lemmie Evens

## 2019-07-21 NOTE — Telephone Encounter (Signed)
Patient called and notified med has been approved. He was advised to call back if co-pay is not affordable.

## 2019-07-21 NOTE — Telephone Encounter (Signed)
Patient aware. Rx(s) sent to pharmacy electronically. PA to be submitted if required

## 2019-07-21 NOTE — Telephone Encounter (Signed)
Praluent approved under [employer] from 04/22/2019 - 07/20/2020

## 2019-07-23 ENCOUNTER — Other Ambulatory Visit: Payer: Self-pay

## 2019-07-23 ENCOUNTER — Ambulatory Visit
Admission: RE | Admit: 2019-07-23 | Discharge: 2019-07-23 | Disposition: A | Discharge status: Home / Self Care | Payer: Medicare HMO | Source: Ambulatory Visit | Attending: Orthopaedic Surgery | Admitting: Orthopaedic Surgery

## 2019-07-23 DIAGNOSIS — M4807 Spinal stenosis, lumbosacral region: Secondary | ICD-10-CM

## 2019-07-23 DIAGNOSIS — M48061 Spinal stenosis, lumbar region without neurogenic claudication: Secondary | ICD-10-CM | POA: Diagnosis not present

## 2019-07-28 ENCOUNTER — Encounter: Payer: Self-pay | Admitting: Orthopaedic Surgery

## 2019-07-28 ENCOUNTER — Ambulatory Visit (INDEPENDENT_AMBULATORY_CARE_PROVIDER_SITE_OTHER): Payer: Medicare HMO | Admitting: Orthopaedic Surgery

## 2019-07-28 VITALS — Ht 69.0 in | Wt 176.0 lb

## 2019-07-28 DIAGNOSIS — M5432 Sciatica, left side: Secondary | ICD-10-CM

## 2019-07-28 DIAGNOSIS — M4807 Spinal stenosis, lumbosacral region: Secondary | ICD-10-CM

## 2019-07-28 NOTE — Progress Notes (Signed)
The patient comes in today to go over an MRI of his lumbar spine.  He originally came to me feeling that there may be hip issues because he fell on his hip when he was younger.  He says physical therapy has been helping.  It is simply more of a sciatic issue.  Was sent for an MRI of his lumbar spine and is here today.  He said physical therapy has helped quite a bit and is staying as active as possible.  He did lift 40 pound bags this weekend.  Examination of his left hip still shows a normal hip exam in terms of full range of motion no pain in the groin at all.  There is no pain over the trochanteric area.  There is slight pain on static stretch with tight hamstrings.  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  I did give him a copy of his MRI report.  I feel the neck step for him would be an epidural steroid injection at the left side at L3 but only if the has worsening of his symptoms.  He understands this as well.  All question concerns were answered addressed.  Follow-up is otherwise as needed.

## 2019-08-21 DIAGNOSIS — Z23 Encounter for immunization: Secondary | ICD-10-CM | POA: Diagnosis not present

## 2019-09-29 ENCOUNTER — Other Ambulatory Visit: Payer: Self-pay | Admitting: Internal Medicine

## 2019-09-29 DIAGNOSIS — E785 Hyperlipidemia, unspecified: Secondary | ICD-10-CM

## 2019-10-07 ENCOUNTER — Telehealth: Payer: Self-pay | Admitting: Internal Medicine

## 2019-10-07 NOTE — Telephone Encounter (Signed)
Pt c/o medication issue:  1. Name of Medication: Alirocumab (PRALUENT) 150 MG/ML SOAJ  2. How are you currently taking this medication (dosage and times per day)? Inject 1 Dose into the skin every 14 (fourteen) days.  3. Are you having a reaction (difficulty breathing--STAT)?  4. What is your medication issue? Patient states he is having sinus issues, sore throat, and ear issues.

## 2019-10-07 NOTE — Telephone Encounter (Signed)
Thanks .. sounds like possible known side-effects.   Dr. Lemmie Evens

## 2019-10-07 NOTE — Telephone Encounter (Signed)
Started on the end of August-  He states he constantly feels that he is getting a cold- sore throat, and sinus issues.  He is going to keep taking it until the end of the month, but then will have to decide after the blood tests- because he does not think he can continue it.  Will send to MD to make aware.

## 2019-10-28 ENCOUNTER — Other Ambulatory Visit: Payer: Self-pay

## 2019-10-28 DIAGNOSIS — E785 Hyperlipidemia, unspecified: Secondary | ICD-10-CM

## 2019-10-28 LAB — LIPID PANEL
Chol/HDL Ratio: 1.7 ratio (ref 0.0–5.0)
Cholesterol, Total: 85 mg/dL — ABNORMAL LOW (ref 100–199)
HDL: 51 mg/dL (ref >39)
LDL Chol Calc (NIH): 17 mg/dL (ref 0–99)
Triglycerides: 83 mg/dL (ref 0–149)
VLDL Cholesterol Cal: 17 mg/dL (ref 5–40)

## 2019-10-29 NOTE — Telephone Encounter (Signed)
Side effects in the trial were not separated between the 75 mg and 150 mg groups - so cannot answer that question. If the side-effects are not tolerable, then would discontinue the med and we will have to consider an alternative.  Dr Lemmie Evens

## 2019-10-29 NOTE — Telephone Encounter (Signed)
Patient called w/his lab results. He was pleased with readings. He does reports continued side effects on Praluent, stating he "always feels like I will get a cold". He reports dry throat, eustachian tube issues, worsening allergy (ragweed) season.   Will update MD. Marena Chancy if SE are dose dependent? Try lower dose if not?

## 2019-10-31 NOTE — Telephone Encounter (Signed)
If he cannot tolerate it - then stay off until follow-up appt.  Dr Lemmie Evens

## 2019-11-03 NOTE — Telephone Encounter (Signed)
Follow Up  Patient is returning call about medication. Please give a call back on mobile phone number.

## 2019-11-03 NOTE — Telephone Encounter (Signed)
Patient called with MD advice. He wishes to cancel Rx for Praluent - [facility] notified. He would like to cancel March 2021 visit and just be seen in August 2021 for annual follow up. He will see PCP in May/June and have labs then.

## 2019-11-03 NOTE — Telephone Encounter (Signed)
LMTCB

## 2019-11-03 NOTE — Addendum Note (Signed)
Addended by: Fidel Levy on: 11/03/2019 12:18 PM   Modules accepted: Orders

## 2019-12-04 DIAGNOSIS — L570 Actinic keratosis: Secondary | ICD-10-CM | POA: Diagnosis not present

## 2019-12-04 DIAGNOSIS — L738 Other specified follicular disorders: Secondary | ICD-10-CM | POA: Diagnosis not present

## 2019-12-04 DIAGNOSIS — D1801 Hemangioma of skin and subcutaneous tissue: Secondary | ICD-10-CM | POA: Diagnosis not present

## 2019-12-04 DIAGNOSIS — D225 Melanocytic nevi of trunk: Secondary | ICD-10-CM | POA: Diagnosis not present

## 2019-12-04 DIAGNOSIS — L720 Epidermal cyst: Secondary | ICD-10-CM | POA: Diagnosis not present

## 2019-12-04 DIAGNOSIS — L812 Freckles: Secondary | ICD-10-CM | POA: Diagnosis not present

## 2019-12-04 DIAGNOSIS — L821 Other seborrheic keratosis: Secondary | ICD-10-CM | POA: Diagnosis not present

## 2019-12-27 ENCOUNTER — Ambulatory Visit: Payer: Medicare HMO

## 2020-01-01 ENCOUNTER — Ambulatory Visit: Payer: Medicare HMO

## 2020-01-26 ENCOUNTER — Ambulatory Visit: Payer: Medicare HMO | Admitting: Internal Medicine

## 2020-03-05 ENCOUNTER — Telehealth: Payer: Self-pay | Admitting: Internal Medicine

## 2020-03-05 NOTE — Telephone Encounter (Signed)
Left message for patient to call back to reschedule 6 month follow up with Dr. Debara Pickett.

## 2020-07-21 ENCOUNTER — Encounter: Payer: Self-pay | Admitting: Internal Medicine

## 2020-07-21 ENCOUNTER — Other Ambulatory Visit: Payer: Self-pay

## 2020-07-21 ENCOUNTER — Ambulatory Visit (INDEPENDENT_AMBULATORY_CARE_PROVIDER_SITE_OTHER): Payer: Medicare HMO | Admitting: Internal Medicine

## 2020-07-21 VITALS — BP 128/76 | HR 62 | Temp 96.9°F | Ht 69.0 in | Wt 180.4 lb

## 2020-07-21 DIAGNOSIS — I2510 Atherosclerotic heart disease of native coronary artery without angina pectoris: Secondary | ICD-10-CM

## 2020-07-21 DIAGNOSIS — I10 Essential (primary) hypertension: Secondary | ICD-10-CM | POA: Diagnosis not present

## 2020-07-21 DIAGNOSIS — Z9861 Coronary angioplasty status: Secondary | ICD-10-CM

## 2020-07-21 DIAGNOSIS — E785 Hyperlipidemia, unspecified: Secondary | ICD-10-CM

## 2020-07-21 NOTE — Patient Instructions (Signed)

## 2020-07-21 NOTE — Progress Notes (Signed)
OFFICE NOTE  Chief Complaint:  No complaints  Primary Care Physician: Marton Redwood, MD  HPI:  Calvin Patterson  is a 69 year old gentleman, who I have been following for a history of a non-ST elevation MI in January 2013. He underwent cardiac catheterization, which showed a high grade circumflex lesion, underwent cutting balloon atherectomy with marked improvement of his symptoms. He was on aspirin and Brilinta following that; however, in April, developed a hyphema or perhaps some other type of bleeding disorder to the eye, which he then discontinued his Brilinta. He has done fine since that time and is active, walking two to three miles, one to three times a week without any symptoms. Overall denies any chest pain, shortness of breath, palpitations, presyncope, or syncopal symptoms. His only concerns have to do with muscle aches and pains which he partially attributes to his statin medications.  Calvin Patterson returns today and is feeling quite well. He remains active and denies any shortness of breath or chest pain. In January he underwent a lipid NMR which demonstrated a LDL particle number of 1295, and LDL content of 106. He has since been more regularly taking Crestor 5 mg for 2 days and then off for one day. This seems to work at minimizing his myalgias. Otherwise he has no complaints.   Calvin Patterson returns today for follow-up.  Overall he reports doing really well. He has good energy level and no chest pain or worsening shortness of breath. Recently had lab work done through his primary care provider which indicated a total cholesterol 184, triglycerides 117, HDL 62 and LDL 99. Marked improvement however he has been a little intolerant to the Crestor. Currently he is taking 5 mg every other day. In addition he was recently started on Zetia which I think will be additionally helpful to reach his cholesterol. He is on low-dose lisinopril and blood pressure is low to normal but otherwise well  controlled. Energy level is good and he remains active.  06/16/2016  Calvin Patterson was seen in follow-up today in the office. He seems to be doing well. He recently went on a hiking and fishing trip in [place] and was asymptomatic with that. He exercises regularly. Recent cholesterol still shows LDL in the 90s. He's been intolerant of Crestor and can take it very infrequently only. He is on Zetia. He's very care provider was talking to him about possibly adding a PC SK 9 inhibitor.  07/03/2017  Calvin Patterson returns today for follow-up. Overall he seems to be doing well. Denies any chest pain or worsening shortness of breath. He is physically active. He reports a healthy diet. His blood pressures been well controlled and is at goal today. Recently I reviewed lab work from his primary care provider, which shows his total cholesterol 146, triglycerides 102, HL-C 57 and LDL-C 69. This is finally at goal on ezetimibe 10 mg and rosuvastatin 5 mg every third day. He reported when taking rosuvastatin every other day he had some myalgias. This regimen seems to be tolerable to him and is getting him to goal.  07/03/2018  Calvin Patterson was seen today for annual follow-up.  Over the past year he has been very stable.  He denies any new chest pain or worsening shortness of breath.  Weight is about 1 pound heavier than it was previously.  Blood pressure is well controlled 124/78.  He reports compliance with his medications including ezetimibe and rosuvastatin 5 mg which he takes every third day.  Unfortunately he is not been able to tolerate any more frequent therapy.  He does have a history of statin intolerance with myalgias on both simvastatin, atorvastatin and pitavastatin.  His LDL previously was 69 however recently increased to 94.  Likely related to dietary changes.  We discussed the possibility of him going on additional therapy with PCSK9 inhibitor versus increasing the dose of his rosuvastatin.  He said he would be willing  to try a more statin however is concerned about side effects.  07/21/2020  Calvin Patterson returns today for follow-up.  I last saw him via telemedicine visit.  He is continued to do well.  I suggested that he try Praluent and he was approved for that.  Unfortunately he had significant side effects with it including sinus congestion and, neck pain issues and other side effects ultimately led to him discontinuing it.  It did significantly lower his cholesterol, last measured with total 85, triglycerides 83, HDL 51 and LDL of 17.  Follow-up labs 2 months later showed a rebound cholesterol to total 124, HDL 58, LDL 45 and triglycerides 80.  He continues to stay active denies any chest pain.  He cannot tolerate higher doses of rosuvastatin then he is currently on every third day and he takes Zetia 10 mg daily.  PMHx:  Past Medical History:  Diagnosis Date  . Angina   . Arthritis   . Coronary artery disease   . Diverticulosis   . 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 Dyslipidemia 11/30/2011  . HTN (hypertension) 11/30/2011  . Hypercholesteremia   . Hypertension   . NSTEMI (non-ST elevated myocardial infarction), 11/30/11 11/30/2011  . S/P percutaneous transluminal coronary angioplasty, with cutting baloon, av groove of LCX, 11/30/11 11/30/2011    Past Surgical History:  Procedure Laterality Date  . APPENDECTOMY  1962  . CARDIAC CATHETERIZATION  11/30/2011   high grade stable lesion in left Cfx (Dr. Jerilynn Mages. Croitoru) - atherectomyy & angioplasty of AV groove Cfx with Flexetome Cutting Balloon by Dr. Roni Bread  . LEFT HEART CATHETERIZATION WITH CORONARY ANGIOGRAM N/A 11/30/2011   Procedure: LEFT HEART CATHETERIZATION WITH CORONARY ANGIOGRAM;  Surgeon: Sanda Klein, MD;  Location: [facility];  Service: Cardiovascular;  Laterality: N/A;  . PERCUTANEOUS CORONARY INTERVENTION-BALLOON ONLY  11/30/2011   Procedure: PERCUTANEOUS CORONARY INTERVENTION-BALLOON ONLY;  Surgeon: Sanda Klein, MD;   Location: [facility];  Service: Cardiovascular;;  . TONSILLECTOMY  1958  . WISDOM TOOTH EXTRACTION  1974    FAMHx:  Family History  Problem Relation Age of Onset  . Coronary artery disease Father   . Heart disease Father   . Lung cancer Father   . Heart failure Mother   . Dementia Mother   . Heart disease Paternal Grandmother   . Heart disease Paternal Grandfather   . Colon cancer Neg Hx   . Stomach cancer Neg Hx     SOCHx:   reports that he has never smoked. He has never used smokeless tobacco. He reports current alcohol use of about 7.0 standard drinks of alcohol per week. He reports that he does not use drugs.  ALLERGIES:  Allergies  Allergen Reactions  . Praluent [Alirocumab] Other (See Comments)    dry throat, eustachian tube issues, worsening allergies  . Statins     Muscle aches    ROS: A comprehensive review of systems was negative.  HOME MEDS: Current Outpatient Medications  Medication Sig Dispense Refill  . aspirin 81 MG tablet Take 81 mg  by mouth daily.    . Coenzyme Q10 (EQL COQ10) 300 MG CAPS Take 1 capsule by mouth daily.    Marland Kitchen ezetimibe (ZETIA) 10 MG tablet Take 10 mg by mouth daily.    Marland Kitchen lisinopril (PRINIVIL,ZESTRIL) 10 MG tablet Take 10 mg by mouth daily.  3  . Multiple Vitamins-Minerals (MULTIVITAMINS THER. W/MINERALS) TABS Take 1 tablet by mouth daily.      . rosuvastatin (CRESTOR) 5 MG tablet Take 5 mg by mouth daily. Takes every 3rd day     No current facility-administered medications for this visit.    LABS/IMAGING: No results found for this or any previous visit (from the past 48 hour(s)). No results found.  VITALS: BP 128/76   Pulse 62   Temp (!) 96.9 F (36.1 C)   Ht 5\' 9"  (1.753 m)   Wt 180 lb 6.4 oz (81.8 kg)   SpO2 98%   BMI 26.64 kg/m   EXAM: General appearance: alert and no distress Neck: no adenopathy, no carotid bruit, no JVD, supple, symmetrical, trachea midline and thyroid not enlarged, symmetric, no  tenderness/mass/nodules Lungs: clear to auscultation bilaterally Heart: regular rate and rhythm, S1, S2 normal, no murmur, click, rub or gallop Abdomen: soft, non-tender; bowel sounds normal; no masses,  no organomegaly Extremities: extremities normal, atraumatic, no cyanosis or edema Pulses: 2+ and symmetric Skin: Skin color, texture, turgor normal. No rashes or lesions Neurologic: Grossly normal  EKG: Normal sinus rhythm at 62-personally reviewed  ASSESSMENT: 1. Coronary artery disease status post non-ST elevation MI with cutting balloon atherectomy to the AV circumflex (2013) 2. Hypertension 3. Dyslipidemia  PLAN: 1.   Calvin Patterson continues to do well is asymptomatic now 8 years past NSTEMI with Cutting Balloon atherectomy to the AV circumflex.  Blood pressures well controlled.  His cholesterol is near goal however he cannot tolerate Praluent due to side effects, even though this did achieve significant LDL lowering.  LDL is now rebounded up into the low 80s on Zetia and max tolerated dose of rosuvastatin 5 mg every third day.  Continue current medications, activity and monitor diet.  Plan follow-up with me annually or sooner as necessary.  Pixie Casino, MD, [facility], [place] Director of the [facility]  [facility] Diplomate of the American Board of Clinical Lipidology Attending Cardiologist  Direct Dial: [phone]  Fax: [phone]  Website:  [email or web address] 07/21/2020, 8:56 AM

## 2020-07-23 IMAGING — MR MRI LUMBAR SPINE WITHOUT CONTRAST
5 series · 43 of 48 positions shown · non-contrast
Comparison: Lumbar radiographs 06/23/2019.

CLINICAL DATA: 67-year-old male with 4 months of pain radiating to
the buttock and hip.

EXAM:
MRI LUMBAR SPINE WITHOUT CONTRAST
TECHNIQUE: Multiplanar, multisequence MR imaging of the lumbar spine was
performed. No intravenous contrast was administered.

[Series 2: tirm sag · sagittal · 4.0mm · 0.55mm/px · 6 of 13 slices shown]
[im 1/13]
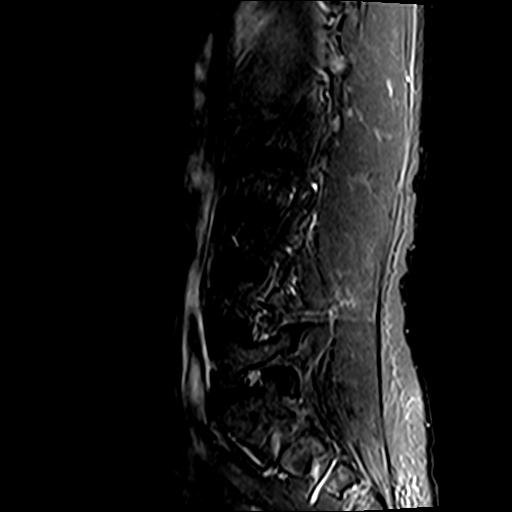
[im 3/13]
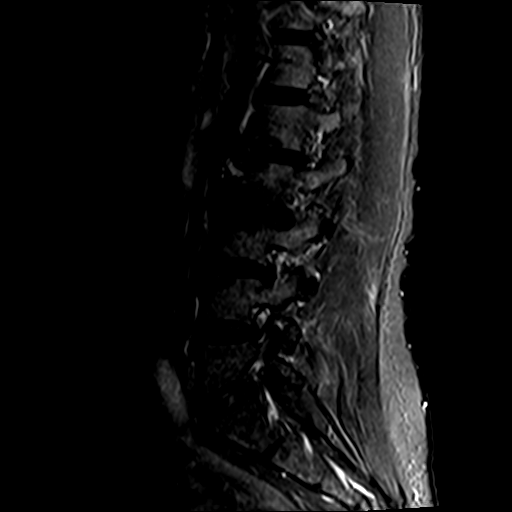
[im 5/13]
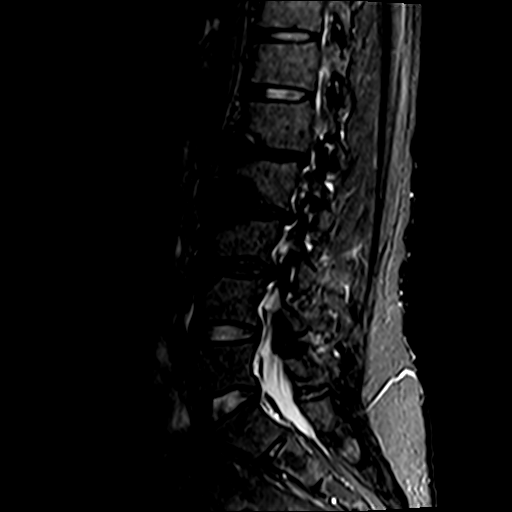
[im 8/13]
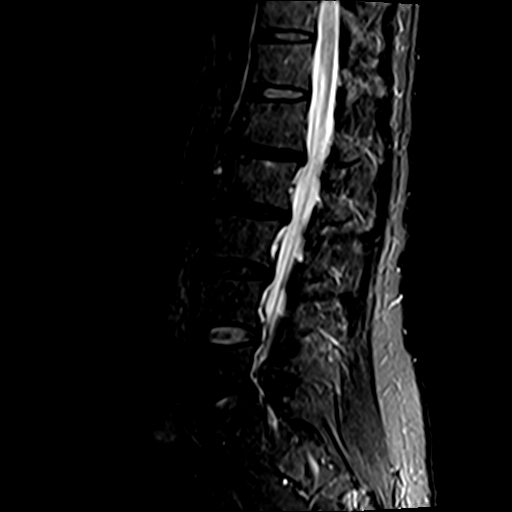
[im 10/13]
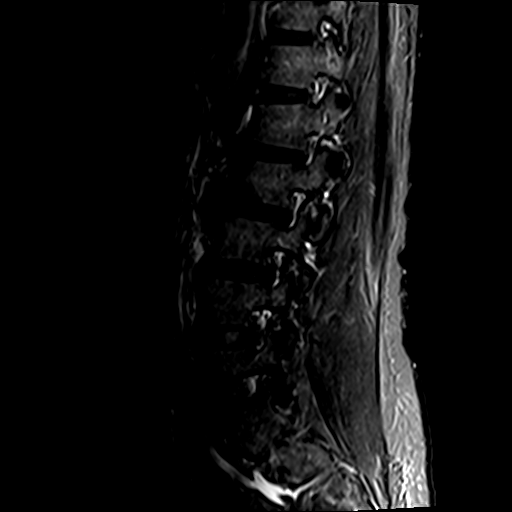
[im 13/13]
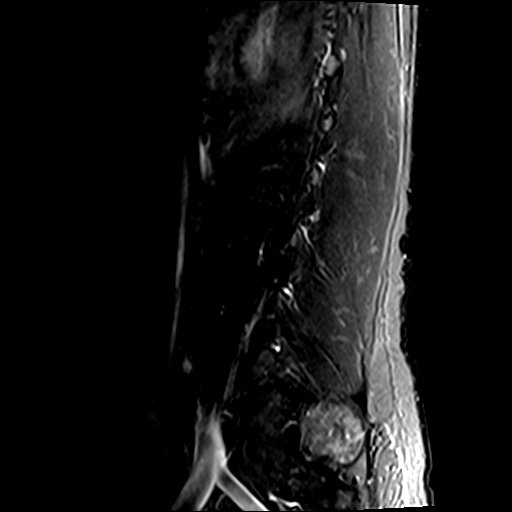

[Series 3: T2 · sagittal · 4.0mm · 0.88mm/px · 5 of 13 slices shown (1 of 2)]
[im 1/13]
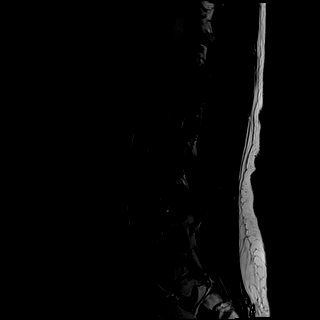
[im 4/13]
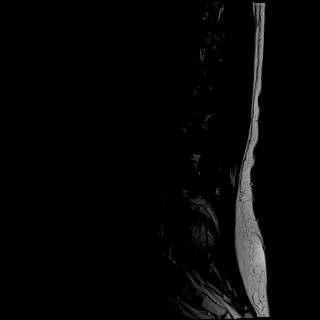
[im 7/13]
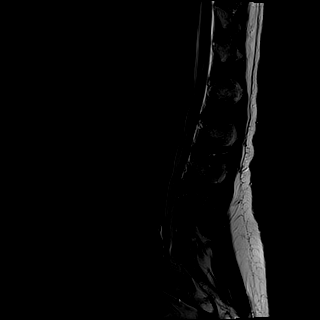
[im 10/13]
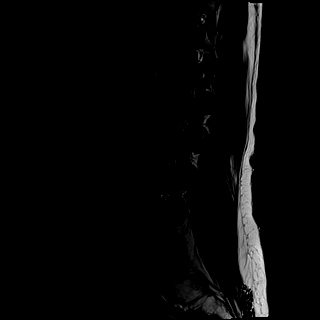
[im 13/13]
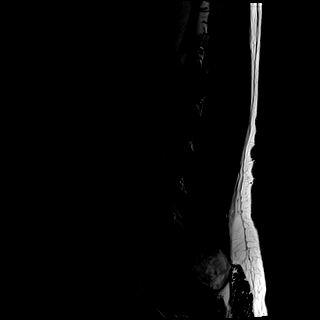

[Series 4: T1 · sagittal · 4.0mm · 0.88mm/px · 5 of 13 slices shown (1 of 2)]
[im 1/13]
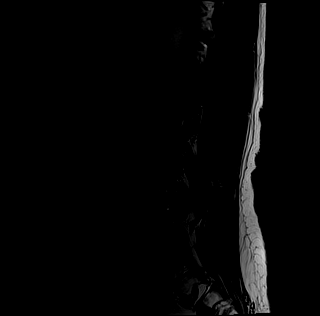
[im 4/13]
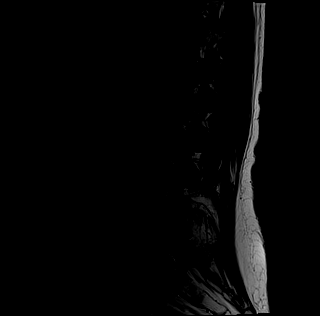
[im 7/13]
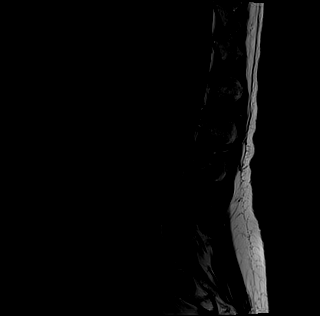
[im 10/13]
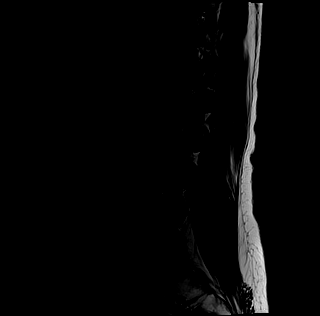
[im 13/13]
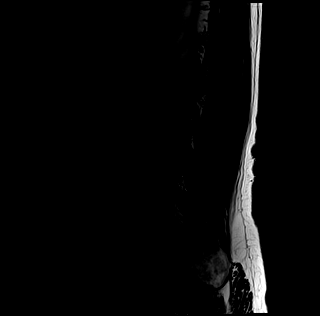

[Series 5: T1 · axial · 4.0mm · 0.78mm/px · z∈[-94,+108]mm · 11 of 38 slices shown (2 of 2)]
[im 1/38]
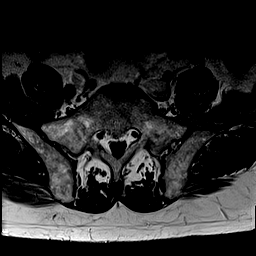
[im 3/38]
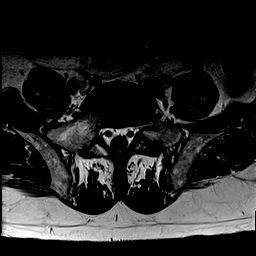
[im 5/38]
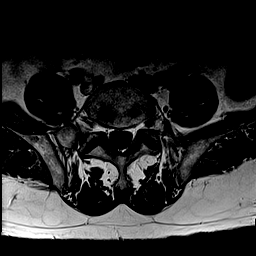
[im 8/38]
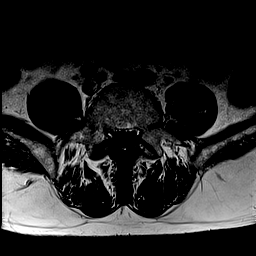
[im 13/38]
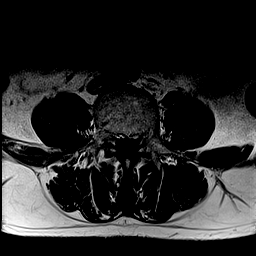
[im 18/38]
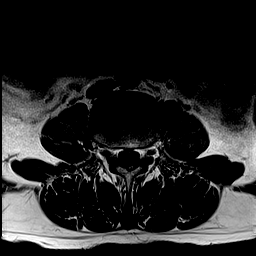
[im 20/38]
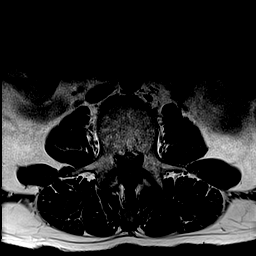
[im 23/38]
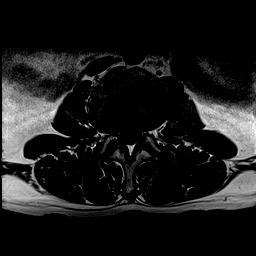
[im 28/38]
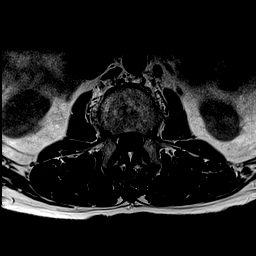
[im 33/38]
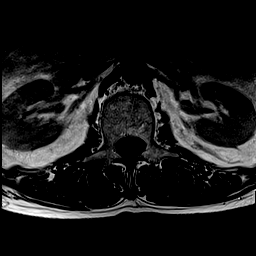
[im 38/38]
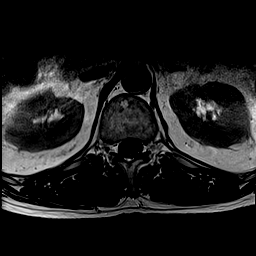

[Series 6: T2 · axial · 4.0mm · 0.78mm/px · z∈[-94,+108]mm · 16 of 38 slices shown (2 of 2)]
[im 1/38]
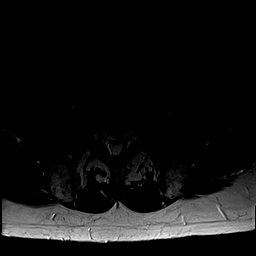
[im 3/38]
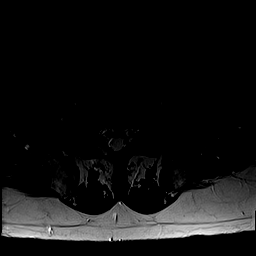
[im 5/38]
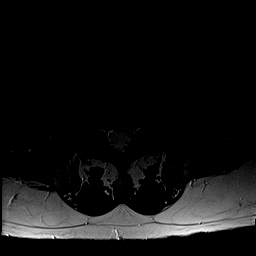
[im 8/38]
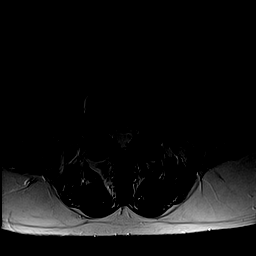
[im 10/38]
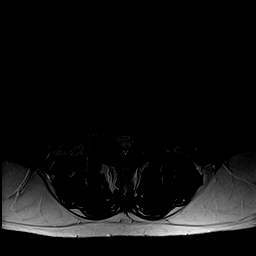
[im 13/38]
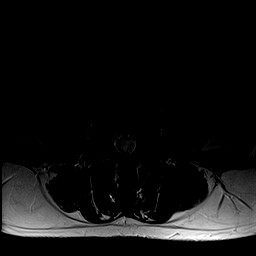
[im 15/38]
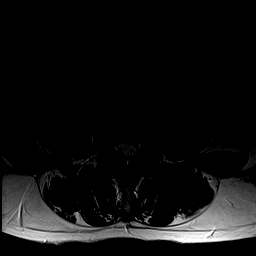
[im 18/38]
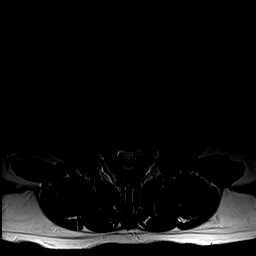
[im 20/38]
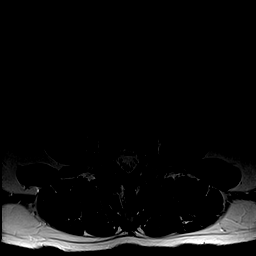
[im 23/38]
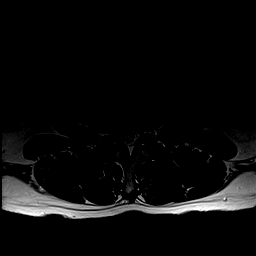
[im 25/38]
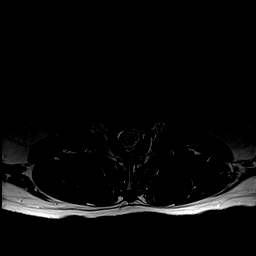
[im 28/38]
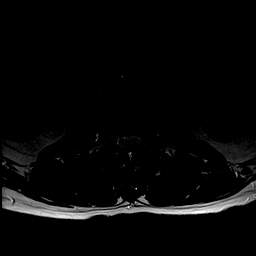
[im 30/38]
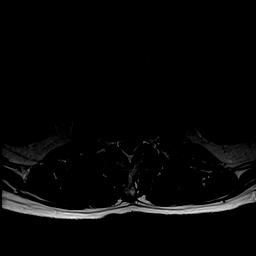
[im 33/38]
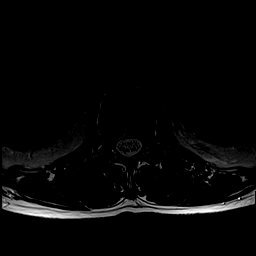
[im 35/38]
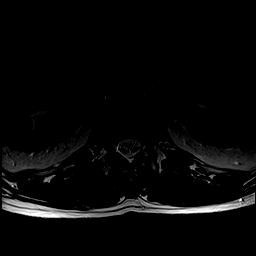
[im 38/38]
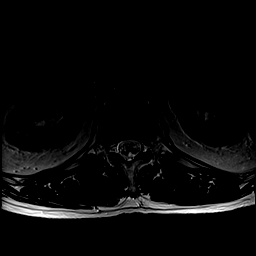

[43 of 48 positions shown; findings below may reference images not displayed]

FINDINGS: Segmentation:  Normal on the comparison radiographs.

Alignment: Stable straightening of lumbar lordosis. Subtle
multilevel retrolisthesis, including at L3-L4 and L5-S1.

Vertebrae: Faint degenerative endplate marrow edema superiorly at
L2. Superimposed chronic degenerative endplate changes at L1-L2 and
L2-L3, including small chronic Schmorl's nodes. Background bone
marrow signal is within normal limits. Intact visible sacrum and SI
joints.

Conus medullaris and cauda equina: Conus extends to the T12 level.
No lower spinal cord or conus signal abnormality.

Paraspinal and other soft tissues: Negative.

Disc levels:

T11-T12: Mild facet hypertrophy.

T12-L1:  Negative.

L1-L2: Disc desiccation, disc space loss and circumferential disc
bulge. Mild posterior element hypertrophy. Mild spinal and bilateral
L1 foraminal stenosis.

L2-L3: Disc desiccation, disc space loss, and circumferential disc
bulge eccentric to the right. Mild posterior element hypertrophy. No
significant spinal or convincing lateral recess stenosis. Mild left
and mild to moderate right L2 foraminal stenosis.

L3-L4: Disc desiccation and circumferential disc bulge. Mild to
moderate posterior element hypertrophy. No spinal or convincing
lateral recess stenosis. Mild to moderate bilateral L3 foraminal
stenosis.

L4-L5: Mild far lateral disc bulging. Moderate posterior element
hypertrophy. Mild bilateral L4 foraminal stenosis.

L5-S1: Negative disc. Mild to moderate posterior element
hypertrophy. Mild bilateral L5 foraminal stenosis.
IMPRESSION: 1. Lumbar disc and endplate degeneration maximal at L1-L2 and L2-L3.
Up to mild associated spinal stenosis, but no convincing lumbar
lateral recess stenosis.
2. Mild to moderate multifactorial bilateral lumbar foraminal
stenosis, most pronounced at the right L2 and bilateral L3 nerve
levels.

## 2021-08-12 ENCOUNTER — Ambulatory Visit (INDEPENDENT_AMBULATORY_CARE_PROVIDER_SITE_OTHER): Payer: Medicare HMO | Admitting: Internal Medicine

## 2021-08-12 ENCOUNTER — Encounter: Payer: Self-pay | Admitting: Internal Medicine

## 2021-08-12 ENCOUNTER — Other Ambulatory Visit: Payer: Self-pay

## 2021-08-12 VITALS — BP 116/64 | HR 61 | Ht 69.0 in | Wt 181.8 lb

## 2021-08-12 DIAGNOSIS — E785 Hyperlipidemia, unspecified: Secondary | ICD-10-CM | POA: Diagnosis not present

## 2021-08-12 DIAGNOSIS — I10 Essential (primary) hypertension: Secondary | ICD-10-CM | POA: Diagnosis not present

## 2021-08-12 DIAGNOSIS — I2510 Atherosclerotic heart disease of native coronary artery without angina pectoris: Secondary | ICD-10-CM

## 2021-08-12 NOTE — Progress Notes (Signed)
OFFICE NOTE  Chief Complaint:  No complaints  Primary Care Physician: Ginger Organ., MD  HPI:  Calvin Patterson  is a 70 year old gentleman, who I have been following for a history of a non-ST elevation MI in January 2013. He underwent cardiac catheterization, which showed a high grade circumflex lesion, underwent cutting balloon atherectomy with marked improvement of his symptoms. He was on aspirin and Brilinta following that; however, in April, developed a hyphema or perhaps some other type of bleeding disorder to the eye, which he then discontinued his Brilinta. He has done fine since that time and is active, walking two to three miles, one to three times a week without any symptoms. Overall denies any chest pain, shortness of breath, palpitations, presyncope, or syncopal symptoms. His only concerns have to do with muscle aches and pains which he partially attributes to his statin medications.  Calvin Patterson returns today and is feeling quite well. He remains active and denies any shortness of breath or chest pain. In January he underwent a lipid NMR which demonstrated a LDL particle number of 1295, and LDL content of 106. He has since been more regularly taking Crestor 5 mg for 2 days and then off for one day. This seems to work at minimizing his myalgias. Otherwise he has no complaints.   Calvin Patterson returns today for follow-up.  Overall he reports doing really well. He has good energy level and no chest pain or worsening shortness of breath. Recently had lab work done through his primary care provider which indicated a total cholesterol 184, triglycerides 117, HDL 62 and LDL 99. Marked improvement however he has been a little intolerant to the Crestor. Currently he is taking 5 mg every other day. In addition he was recently started on Zetia which I think will be additionally helpful to reach his cholesterol. He is on low-dose lisinopril and blood pressure is low to normal but otherwise  well controlled. Energy level is good and he remains active.  06/16/2016  Calvin Patterson was seen in follow-up today in the office. He seems to be doing well. He recently went on a hiking and fishing trip in [place] and was asymptomatic with that. He exercises regularly. Recent cholesterol still shows LDL in the 90s. He's been intolerant of Crestor and can take it very infrequently only. He is on Zetia. He's very care provider was talking to him about possibly adding a PC SK 9 inhibitor.  07/03/2017  Calvin Patterson returns today for follow-up. Overall he seems to be doing well. Denies any chest pain or worsening shortness of breath. He is physically active. He reports a healthy diet. His blood pressures been well controlled and is at goal today. Recently I reviewed lab work from his primary care provider, which shows his total cholesterol 146, triglycerides 102, HL-C 57 and LDL-C 69. This is finally at goal on ezetimibe 10 mg and rosuvastatin 5 mg every third day. He reported when taking rosuvastatin every other day he had some myalgias. This regimen seems to be tolerable to him and is getting him to goal.  07/03/2018  Calvin Patterson was seen today for annual follow-up.  Over the past year he has been very stable.  He denies any new chest pain or worsening shortness of breath.  Weight is about 1 pound heavier than it was previously.  Blood pressure is well controlled 124/78.  He reports compliance with his medications including ezetimibe and rosuvastatin 5 mg which he takes every  third day.  Unfortunately he is not been able to tolerate any more frequent therapy.  He does have a history of statin intolerance with myalgias on both simvastatin, atorvastatin and pitavastatin.  His LDL previously was 69 however recently increased to 94.  Likely related to dietary changes.  We discussed the possibility of him going on additional therapy with PCSK9 inhibitor versus increasing the dose of his rosuvastatin.  He said he would be  willing to try a more statin however is concerned about side effects.  07/21/2020  Calvin Patterson returns today for follow-up.  I last saw him via telemedicine visit.  He is continued to do well.  I suggested that he try Praluent and he was approved for that.  Unfortunately he had significant side effects with it including sinus congestion and, neck pain issues and other side effects ultimately led to him discontinuing it.  It did significantly lower his cholesterol, last measured with total 85, triglycerides 83, HDL 51 and LDL of 17.  Follow-up labs 2 months later showed a rebound cholesterol to total 124, HDL 58, LDL 45 and triglycerides 80.  He continues to stay active denies any chest pain.  He cannot tolerate higher doses of rosuvastatin then he is currently on every third day and he takes Zetia 10 mg daily.  08/12/2021  Calvin Patterson returns today for follow-up.  Overall he says he is doing well.  He denies any chest pain or worsening shortness of breath.  Blood pressure is well controlled.  He did bring a list of blood pressures at home all of which show excellent control.  He had lipids in June 2021 with LDL slightly elevated at 87.  EKG shows normal sinus rhythm today.  Target LDL is less than 70.  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 Jerilynn Mages. Croitoru) - atherectomyy & angioplasty of AV groove Cfx with Flexetome Cutting Balloon by Dr. Roni Bread    LEFT HEART CATHETERIZATION WITH CORONARY ANGIOGRAM N/A 11/30/2011   Procedure: [place];  Surgeon: Sanda Klein, MD;  Location: [facility] CATH LAB;  Service: Cardiovascular;  Laterality: N/A;   PERCUTANEOUS CORONARY INTERVENTION-BALLOON ONLY  11/30/2011   Procedure: PERCUTANEOUS CORONARY INTERVENTION-BALLOON ONLY;  Surgeon: Sanda Klein, MD;  Location: [facility] CATH LAB;  Service: Cardiovascular;;   TONSILLECTOMY  1958   WISDOM TOOTH EXTRACTION  1974    FAMHx:  Family History  Problem Relation Age of Onset   Coronary artery disease Father    Heart disease Father    Lung cancer Father    Heart failure Mother    Dementia Mother    Heart disease Paternal Grandmother    Heart disease Paternal Grandfather    Colon cancer Neg Hx    Stomach cancer Neg Hx     SOCHx:   reports that he has never smoked. He has never used smokeless tobacco. He reports current alcohol use  of about 7.0 standard drinks per week. He reports that he does not use drugs.  ALLERGIES:  Allergies  Allergen Reactions   Praluent [Alirocumab] Other (See Comments)    dry throat, eustachian tube issues, worsening allergies   Statins     Muscle aches    ROS: A comprehensive review of systems was negative.  HOME MEDS: Current Outpatient Medications  Medication Sig Dispense Refill   aspirin 81 MG tablet Take 81 mg by mouth daily.     Coenzyme Q10 300 MG CAPS Take 1 capsule by mouth daily.     ezetimibe (ZETIA) 10 MG tablet Take 10 mg by mouth daily.     lisinopril (PRINIVIL,ZESTRIL) 10 MG tablet Take 10 mg by mouth daily.  3   Multiple Vitamins-Minerals (MULTIVITAMINS THER. W/MINERALS) TABS Take 1 tablet by mouth daily.       rosuvastatin (CRESTOR) 5 MG tablet Take 5 mg by mouth daily. Takes every 3rd day     No current facility-administered medications for this visit.    LABS/IMAGING: No results found for this or any previous visit (from the past 48 hour(s)). No results  found.  VITALS: BP 116/64   Pulse 61   Ht '5\' 9"'$  (1.753 m)   Wt 181 lb 12.8 oz (82.5 kg)   SpO2 96%   BMI 26.85 kg/m   EXAM: General appearance: alert and no distress Neck: no adenopathy, no carotid bruit, no JVD, supple, symmetrical, trachea midline and thyroid not enlarged, symmetric, no tenderness/mass/nodules Lungs: clear to auscultation bilaterally Heart: regular rate and rhythm, S1, S2 normal, no murmur, click, rub or gallop Abdomen: soft, non-tender; bowel sounds normal; no masses,  no organomegaly Extremities: extremities normal, atraumatic, no cyanosis or edema Pulses: 2+ and symmetric Skin: Skin color, texture, turgor normal. No rashes or lesions Neurologic: Grossly normal  EKG: Normal sinus rhythm at 61-personally reviewed  ASSESSMENT: Coronary artery disease status post non-ST elevation MI with cutting balloon atherectomy to the AV circumflex (2013) Hypertension Dyslipidemia  PLAN: 1.   Calvin Patterson is doing quite well.  His cholesterol is still somewhat above goal however he declined additional medications at this time.  He can want to continue to work with diet and exercise.  Blood pressure is well controlled.  Denies any chest pain or worsening shortness of breath.  No other medication changes today.  Plan follow-up annually or sooner as necessary.  Pixie Casino, MD, [facility], [place] Director of the Advanced Lipid Disorders &  Cardiovascular Risk Reduction Clinic Diplomate of the American Board of Clinical Lipidology Attending Cardiologist  Direct Dial: [phone]  Fax: [phone]  Website:  [email or web address] 08/12/2021, 9:46 AM

## 2021-08-12 NOTE — Patient Instructions (Signed)

## 2022-06-30 ENCOUNTER — Other Ambulatory Visit: Payer: Self-pay | Admitting: Internal Medicine

## 2022-06-30 DIAGNOSIS — R0989 Other specified symptoms and signs involving the circulatory and respiratory systems: Secondary | ICD-10-CM

## 2022-07-26 ENCOUNTER — Ambulatory Visit
Admission: RE | Admit: 2022-07-26 | Discharge: 2022-07-26 | Disposition: A | Discharge status: Home / Self Care | Payer: Medicare HMO | Source: Ambulatory Visit | Attending: Internal Medicine | Admitting: Internal Medicine

## 2022-07-26 DIAGNOSIS — R0989 Other specified symptoms and signs involving the circulatory and respiratory systems: Secondary | ICD-10-CM

## 2022-08-11 ENCOUNTER — Encounter: Payer: Self-pay | Admitting: Internal Medicine

## 2022-08-14 ENCOUNTER — Telehealth: Payer: Self-pay | Admitting: Internal Medicine

## 2022-09-29 ENCOUNTER — Ambulatory Visit (AMBULATORY_SURGERY_CENTER): Payer: Self-pay

## 2022-09-29 VITALS — Ht 69.0 in | Wt 179.0 lb

## 2022-09-29 DIAGNOSIS — Z1211 Encounter for screening for malignant neoplasm of colon: Secondary | ICD-10-CM

## 2022-09-29 DIAGNOSIS — Z86010 Personal history of colonic polyps: Secondary | ICD-10-CM

## 2022-09-29 MED ORDER — NA SULFATE-K SULFATE-MG SULF 17.5-3.13-1.6 GM/177ML PO SOLN: 1.0000 | Freq: Once | ORAL | 0 refills | Status: AC

## 2022-09-29 NOTE — Progress Notes (Signed)

## 2022-10-12 ENCOUNTER — Ambulatory Visit: Payer: Medicare HMO | Attending: Internal Medicine | Admitting: Internal Medicine

## 2022-10-12 ENCOUNTER — Encounter: Payer: Self-pay | Admitting: Internal Medicine

## 2022-10-12 VITALS — BP 123/75 | HR 65 | Ht 69.0 in | Wt 181.4 lb

## 2022-10-12 DIAGNOSIS — E785 Hyperlipidemia, unspecified: Secondary | ICD-10-CM

## 2022-10-12 DIAGNOSIS — I2510 Atherosclerotic heart disease of native coronary artery without angina pectoris: Secondary | ICD-10-CM | POA: Diagnosis not present

## 2022-10-12 DIAGNOSIS — I10 Essential (primary) hypertension: Secondary | ICD-10-CM | POA: Diagnosis not present

## 2022-10-12 NOTE — Progress Notes (Signed)
OFFICE NOTE  Chief Complaint:  No complaints  Primary Care Physician: Ginger Organ., MD  HPI:  Calvin Patterson  is a 71 year old gentleman, who I have been following for a history of a non-ST elevation MI in January 2013. He underwent cardiac catheterization, which showed a high grade circumflex lesion, underwent cutting balloon atherectomy with marked improvement of his symptoms. He was on aspirin and Brilinta following that; however, in April, developed a hyphema or perhaps some other type of bleeding disorder to the eye, which he then discontinued his Brilinta. He has done fine since that time and is active, walking two to three miles, one to three times a week without any symptoms. Overall denies any chest pain, shortness of breath, palpitations, presyncope, or syncopal symptoms. His only concerns have to do with muscle aches and pains which he partially attributes to his statin medications.  Calvin Patterson returns today and is feeling quite well. He remains active and denies any shortness of breath or chest pain. In January he underwent a lipid NMR which demonstrated a LDL particle number of 1295, and LDL content of 106. He has since been more regularly taking Crestor 5 mg for 2 days and then off for one day. This seems to work at minimizing his myalgias. Otherwise he has no complaints.   Calvin Patterson returns today for follow-up.  Overall he reports doing really well. He has good energy level and no chest pain or worsening shortness of breath. Recently had lab work done through his primary care provider which indicated a total cholesterol 184, triglycerides 117, HDL 62 and LDL 99. Marked improvement however he has been a little intolerant to the Crestor. Currently he is taking 5 mg every other day. In addition he was recently started on Zetia which I think will be additionally helpful to reach his cholesterol. He is on low-dose lisinopril and blood pressure is low to normal but otherwise well  controlled. Energy level is good and he remains active.  06/16/2016  Calvin Patterson was seen in follow-up today in the office. He seems to be doing well. He recently went on a hiking and fishing trip in [place] and was asymptomatic with that. He exercises regularly. Recent cholesterol still shows LDL in the 90s. He's been intolerant of Crestor and can take it very infrequently only. He is on Zetia. He's very care provider was talking to him about possibly adding a PC SK 9 inhibitor.  07/03/2017  Calvin Patterson returns today for follow-up. Overall he seems to be doing well. Denies any chest pain or worsening shortness of breath. He is physically active. He reports a healthy diet. His blood pressures been well controlled and is at goal today. Recently I reviewed lab work from his primary care provider, which shows his total cholesterol 146, triglycerides 102, HL-C 57 and LDL-C 69. This is finally at goal on ezetimibe 10 mg and rosuvastatin 5 mg every third day. He reported when taking rosuvastatin every other day he had some myalgias. This regimen seems to be tolerable to him and is getting him to goal.  07/03/2018  Calvin Patterson was seen today for annual follow-up.  Over the past year he has been very stable.  He denies any new chest pain or worsening shortness of breath.  Weight is about 1 pound heavier than it was previously.  Blood pressure is well controlled 124/78.  He reports compliance with his medications including ezetimibe and rosuvastatin 5 mg which he takes  every third day.  Unfortunately he is not been able to tolerate any more frequent therapy.  He does have a history of statin intolerance with myalgias on both simvastatin, atorvastatin and pitavastatin.  His LDL previously was 69 however recently increased to 94.  Likely related to dietary changes.  We discussed the possibility of him going on additional therapy with PCSK9 inhibitor versus increasing the dose of his rosuvastatin.  He said he would be willing  to try a more statin however is concerned about side effects.  07/21/2020  Calvin Patterson returns today for follow-up.  I last saw him via telemedicine visit.  He is continued to do well.  I suggested that he try Praluent and he was approved for that.  Unfortunately he had significant side effects with it including sinus congestion and, neck pain issues and other side effects ultimately led to him discontinuing it.  It did significantly lower his cholesterol, last measured with total 85, triglycerides 83, HDL 51 and LDL of 17.  Follow-up labs 2 months later showed a rebound cholesterol to total 124, HDL 58, LDL 45 and triglycerides 80.  He continues to stay active denies any chest pain.  He cannot tolerate higher doses of rosuvastatin then he is currently on every third day and he takes Zetia 10 mg daily.  08/12/2021  Calvin Patterson returns today for follow-up.  Overall he says he is doing well.  He denies any chest pain or worsening shortness of breath.  Blood pressure is well controlled.  He did bring a list of blood pressures at home all of which show excellent control.  He had lipids in June 2021 with LDL slightly elevated at 87.  EKG shows normal sinus rhythm today.  Target LDL is less than 70.  10/12/2022  Calvin Patterson is seen today in follow-up.  He seems to be doing very well.  He denies any chest pain or shortness of breath.  He saw his PCP over the summer who did blood work including a lipid profile.  The total cholesterol was 161, HDL 66, triglycerides 77 and LDL 80.  His target LDL was less than 70.  He has not tolerated statins well and takes rosuvastatin 5 mg 3 times a week.  He is done okay with Zetia but could not reach target.  We tried a PCSK9 inhibitor but he said he had side effects with the injection.  Ultimately then his PCP put him on Nexlizet, which she says has been excellent.  His LDL is reportedly now down to 57.  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 Jerilynn Mages. [place]) - atherectomyy & angioplasty of AV groove Cfx with Flexetome Cutting Balloon by Dr. Roni Bread   LEFT HEART CATHETERIZATION WITH CORONARY ANGIOGRAM N/A 11/30/2011   Procedure: [place];  Surgeon: Sanda Klein, MD;  Location: [facility] CATH LAB;  Service: Cardiovascular;  Laterality: N/A;  MELANOMA EXCISION Right    face   PERCUTANEOUS CORONARY INTERVENTION-BALLOON ONLY  11/30/2011   Procedure: PERCUTANEOUS CORONARY INTERVENTION-BALLOON ONLY;  Surgeon: Sanda Klein, MD;  Location: [facility] CATH LAB;  Service: Cardiovascular;;   TONSILLECTOMY  1958   WISDOM TOOTH EXTRACTION  1974    FAMHx:  Family History  Problem Relation Age of Onset   Heart failure Mother    Dementia Mother    Coronary artery disease Father    Heart disease Father    Lung cancer Father    Heart disease Paternal Grandmother    Heart disease Paternal Grandfather    Colon cancer Neg Hx    Stomach cancer Neg Hx    Colon polyps Neg Hx    Esophageal cancer Neg Hx    Rectal cancer Neg Hx     SOCHx:   reports that he has never smoked. He has never used smokeless tobacco. He reports current alcohol use of about 7.0 standard drinks of alcohol per week. He reports that he does not use drugs.  ALLERGIES:  Allergies  Allergen Reactions   Lipitor [Atorvastatin] Nausea Only   Praluent [Alirocumab] Other (See Comments)    dry throat, eustachian tube  issues, worsening allergies   Statins     Muscle aches   Vytorin [Ezetimibe-Simvastatin] Nausea Only   Zocor [Simvastatin] Other (See Comments)    gas    ROS: A comprehensive review of systems was negative.  HOME MEDS: Current Outpatient Medications  Medication Sig Dispense Refill   aspirin 81 MG tablet Take 81 mg by mouth daily.     Coenzyme Q10 300 MG CAPS Take 1 capsule by mouth daily.     lisinopril (PRINIVIL,ZESTRIL) 10 MG tablet Take 10 mg by mouth daily.  3   Multiple Vitamins-Minerals (MULTIVITAMINS THER. W/MINERALS) TABS Take 1 tablet by mouth daily.       NEXLIZET 180-10 MG TABS Take 1 tablet by mouth daily.     rosuvastatin (CRESTOR) 5 MG tablet Take 5 mg by mouth daily. Takes every 3rd day     No current facility-administered medications for this visit.    LABS/IMAGING: No results found for this or any previous visit (from the past 48 hour(s)). No results found.  VITALS: BP 123/75 (BP Location: Left Arm, Patient Position: Sitting)   Pulse 65   Ht '5\' 9"'$  (1.753 m)   Wt 181 lb 6.4 oz (82.3 kg)   SpO2 100%   BMI 26.79 kg/m   EXAM: General appearance: alert and no distress Neck: no adenopathy, no carotid bruit, no JVD, supple, symmetrical, trachea midline and thyroid not enlarged, symmetric, no tenderness/mass/nodules Lungs: clear to auscultation bilaterally Heart: regular rate and rhythm, S1, S2 normal, no murmur, click, rub or gallop Abdomen: soft, non-tender; bowel sounds normal; no masses,  no organomegaly Extremities: extremities normal, atraumatic, no cyanosis or edema Pulses: 2+ and symmetric Skin: Skin color, texture, turgor normal. No rashes or lesions Neurologic: Grossly normal  EKG: Normal sinus rhythm at 65-personally reviewed  ASSESSMENT: Coronary artery disease status post non-ST elevation MI with cutting balloon atherectomy to the AV circumflex (2013) Hypertension Dyslipidemia  PLAN: 1.   Calvin Patterson seems to be doing well without any chest  pain or shortness of breath.  He had recently started on Nexlizet per his primary care provider as his LDL was in the 80s.  It is now down to 57.  We will obtain those labs from his PCP.  Blood pressure is well controlled.  He continues  to be active.  Plan follow-up with me annually or sooner as necessary.  Pixie Casino, MD, [facility], [place] Director of the Advanced Lipid Disorders &  Cardiovascular Risk Reduction Clinic Diplomate of the American Board of Clinical Lipidology Attending Cardiologist  Direct Dial: [phone]  Fax: [phone]  Website:  [email or web address] 10/12/2022, 9:23 AM

## 2022-10-12 NOTE — Patient Instructions (Signed)
Medication Instructions:  NO CHANGES  *If you need a refill on your cardiac medications before your next appointment, please call your pharmacy*    Follow-Up: At [facility], you and your health needs are our priority.  As part of our continuing mission to provide you with exceptional heart care, we have created designated Provider Care Teams.  These Care Teams include your primary Cardiologist (physician) and Advanced Practice Providers (APPs -  Physician Assistants and Nurse Practitioners) who all work together to provide you with the care you need, when you need it.  We recommend signing up for the patient portal called "MyChart".  Sign up information is provided on this After Visit Summary.  MyChart is used to connect with patients for Virtual Visits (Telemedicine).  Patients are able to view lab/test results, encounter notes, upcoming appointments, etc.  Non-urgent messages can be sent to your provider as well.   To learn more about what you can do with MyChart, go to [email or web address].    Your next appointment:   12 month(s)  The format for your next appointment:   In Person  Provider:   Kenneth C Hilty, MD   

## 2022-10-25 ENCOUNTER — Encounter: Payer: Self-pay | Admitting: Internal Medicine

## 2022-10-27 ENCOUNTER — Encounter: Payer: Self-pay | Admitting: Internal Medicine

## 2022-10-27 ENCOUNTER — Ambulatory Visit (AMBULATORY_SURGERY_CENTER): Payer: Medicare HMO | Admitting: Internal Medicine

## 2022-10-27 VITALS — BP 123/80 | HR 61 | Temp 97.3°F | Resp 16 | Ht 69.0 in | Wt 179.0 lb

## 2022-10-27 DIAGNOSIS — D122 Benign neoplasm of ascending colon: Secondary | ICD-10-CM | POA: Diagnosis not present

## 2022-10-27 DIAGNOSIS — Z09 Encounter for follow-up examination after completed treatment for conditions other than malignant neoplasm: Secondary | ICD-10-CM

## 2022-10-27 DIAGNOSIS — D125 Benign neoplasm of sigmoid colon: Secondary | ICD-10-CM | POA: Diagnosis not present

## 2022-10-27 DIAGNOSIS — Z86010 Personal history of colonic polyps: Secondary | ICD-10-CM

## 2022-10-27 MED ORDER — SODIUM CHLORIDE 0.9 % IV SOLN: 500.0000 mL | Freq: Once | INTRAVENOUS | Status: DC

## 2022-10-27 NOTE — Progress Notes (Signed)
HISTORY OF PRESENT ILLNESS:  Calvin Patterson is a 71 y.o. male with adenomatous colon polyps.  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 Jerilynn Mages. Croitoru) - atherectomyy & angioplasty of AV groove Cfx with Flexetome Cutting Balloon by Dr. Roni Bread   LEFT HEART CATHETERIZATION WITH CORONARY ANGIOGRAM N/A 11/30/2011   Procedure: [place];  Surgeon: Sanda Klein, MD;  Location: [place] CATH LAB;  Service: Cardiovascular;  Laterality: N/A;   MELANOMA EXCISION Right    face   PERCUTANEOUS CORONARY INTERVENTION-BALLOON ONLY  11/30/2011   Procedure: PERCUTANEOUS CORONARY INTERVENTION-BALLOON ONLY;  Surgeon: Sanda Klein, MD;  Location: [place] CATH LAB;  Service: Cardiovascular;;   [place]    Social History Calvin Patterson  reports that he has never smoked. He has never used smokeless tobacco. He reports current alcohol use of about 7.0 standard drinks of alcohol per week. He reports that he does not use drugs.  family history includes Coronary artery disease in his father; Dementia in his mother; Heart disease in his father, paternal grandfather, and paternal grandmother; Heart failure in his mother; Lung cancer in his father.  Allergies  Allergen Reactions   Lipitor  [Atorvastatin] Nausea Only   Praluent [Alirocumab] Other (See Comments)    dry throat, eustachian tube issues, worsening allergies   Statins     Muscle aches   Vytorin [Ezetimibe-Simvastatin] Nausea Only   Zocor [Simvastatin] Other (See Comments)    gas       PHYSICAL EXAMINATION: Vital signs: BP 129/79   Pulse 62   Temp (!) 97.3 F (36.3 C)   Resp 14   Ht '5\' 9"'$  (1.753 m)   Wt 179 lb (81.2 kg)   SpO2 100%   BMI 26.43 kg/m  General: Well-developed, well-nourished, no acute distress HEENT: Sclerae are anicteric, conjunctiva pink. Oral mucosa intact Lungs: Clear Heart: Regular Abdomen: soft, nontender, nondistended, no obvious ascites, no peritoneal signs, normal bowel sounds. No organomegaly. Extremities: No edema Psychiatric: alert and oriented x3. Cooperative     ASSESSMENT:  History of adenomatous colon polyps   PLAN:   Surveillance colonoscopy

## 2022-10-27 NOTE — Progress Notes (Signed)
Pt's states no medical or surgical changes since previsit or office visit. 

## 2022-10-27 NOTE — Op Note (Addendum)
[facility] Patient Name: Calvin Patterson Procedure Date: 10/27/2022 7:23 AM MRN: [id number] Endoscopist: Docia Chuck. Henrene Pastor , MD, [id number] Age: 70 Referring MD:  Date of Birth: April 29, 1951 Gender: Male Account #: [id number] Procedure:                Colonoscopy with cold snare polypectomy x 2 Indications:              High risk colon cancer surveillance: Personal                            history of multiple adenomas. Previous examinations                            2006, 2013, 2018. Medicines:                Monitored Anesthesia Care Procedure:                Pre-Anesthesia Assessment:                           - Prior to the procedure, a History and Physical                            was performed, and patient medications and                            allergies were reviewed. The patient's tolerance of                            previous anesthesia was also reviewed. The risks                            and benefits of the procedure and the sedation                            options and risks were discussed with the patient.                            All questions were answered, and informed consent                            was obtained. Prior Anticoagulants: The patient has                            taken no anticoagulant or antiplatelet agents. ASA                            Grade Assessment: II - A patient with mild systemic                            disease. After reviewing the risks and benefits,                            the patient was deemed in satisfactory condition to  undergo the procedure.                           After obtaining informed consent, the colonoscope                            was passed under direct vision. Throughout the                            procedure, the patient's blood pressure, pulse, and                            oxygen saturations were monitored continuously. The                            CF [id number]  #[id number] was introduced through the anus                            and advanced to the the cecum, identified by                            appendiceal orifice and ileocecal valve. The                            ileocecal valve, appendiceal orifice, and rectum                            were photographed. The quality of the bowel                            preparation was excellent. The colonoscopy was                            performed without difficulty. The patient tolerated                            the procedure well. The bowel preparation used was                            SUPREP via split dose instruction. Scope In: 8:29:53 AM Scope Out: 8:44:18 AM Scope Withdrawal Time: 0 hours 11 minutes 6 seconds  Total Procedure Duration: 0 hours 14 minutes 25 seconds  Findings:                 Two polyps were found in the sigmoid colon and                            ascending colon. The polyps were 2 to 3 mm in size.                            These polyps were removed with a cold snare.                            Resection and retrieval were complete.  Multiple diverticula were found in the sigmoid                            colon.                           The exam was otherwise without abnormality on                            direct and retroflexion views. Complications:            No immediate complications. Estimated blood loss:                            None. Estimated Blood Loss:     Estimated blood loss: none. Impression:               - Two 2 to 3 mm polyps in the sigmoid colon and in                            the ascending colon, removed with a cold snare.                            Resected and retrieved.                           - Diverticulosis in the sigmoid colon.                           - The examination was otherwise normal on direct                            and retroflexion views. Recommendation:           - Repeat colonoscopy in 5 years  for surveillance.                           - Patient has a contact number available for                            emergencies. The signs and symptoms of potential                            delayed complications were discussed with the                            patient. Return to normal activities tomorrow.                            Written discharge instructions were provided to the                            patient.                           - Resume previous diet.                           -  Continue present medications.                           - Await pathology results. Docia Chuck. Henrene Pastor, MD 10/27/2022 8:56:10 AM This report has been signed electronically.

## 2022-10-27 NOTE — Progress Notes (Signed)
Report to PACU, RN, vss, BBS= Clear.  

## 2022-10-27 NOTE — Patient Instructions (Signed)
Resume previous diet and medications. Awaiting pathology results. Repeat Colonoscopy in 5 years for surveillance. Handouts provided on Colon Polyps and Diverticulosis.  YOU HAD AN ENDOSCOPIC PROCEDURE TODAY AT [facility]:   Refer to the procedure report that was given to you for any specific questions about what was found during the examination.  If the procedure report does not answer your questions, please call your gastroenterologist to clarify.  If you requested that your care partner not be given the details of your procedure findings, then the procedure report has been included in a sealed envelope for you to review at your convenience later.  YOU SHOULD EXPECT: Some feelings of bloating in the abdomen. Passage of more gas than usual.  Walking can help get rid of the air that was put into your GI tract during the procedure and reduce the bloating. If you had a lower endoscopy (such as a colonoscopy or flexible sigmoidoscopy) you may notice spotting of blood in your stool or on the toilet paper. If you underwent a bowel prep for your procedure, you may not have a normal bowel movement for a few days.  Please Note:  You might notice some irritation and congestion in your nose or some drainage.  This is from the oxygen used during your procedure.  There is no need for concern and it should clear up in a day or so.  SYMPTOMS TO REPORT IMMEDIATELY:  Following lower endoscopy (colonoscopy or flexible sigmoidoscopy):  Excessive amounts of blood in the stool  Significant tenderness or worsening of abdominal pains  Swelling of the abdomen that is new, acute  Fever of 100F or higher  For urgent or emergent issues, a gastroenterologist can be reached at any hour by calling [phone]. Do not use MyChart messaging for urgent concerns.    DIET:  We do recommend a small meal at first, but then you may proceed to your regular diet.  Drink plenty of fluids but you should avoid  alcoholic beverages for 24 hours.  ACTIVITY:  You should plan to take it easy for the rest of today and you should NOT DRIVE or use heavy machinery until tomorrow (because of the sedation medicines used during the test).    FOLLOW UP: Our staff will call the number listed on your records the next business day following your procedure.  We will call around 7:15- 8:00 am to check on you and address any questions or concerns that you may have regarding the information given to you following your procedure. If we do not reach you, we will leave a message.     If any biopsies were taken you will be contacted by phone or by letter within the next 1-3 weeks.  Please call us at [phone] if you have not heard about the biopsies in 3 weeks.    SIGNATURES/CONFIDENTIALITY: You and/or your care partner have signed paperwork which will be entered into your electronic medical record.  These signatures attest to the fact that that the information above on your After Visit Summary has been reviewed and is understood.  Full responsibility of the confidentiality of this discharge information lies with you and/or your care-partner.

## 2022-10-30 ENCOUNTER — Telehealth: Payer: Self-pay

## 2022-10-30 NOTE — Telephone Encounter (Signed)
Follow up call to pt, lm for pt to call if having any difficulty with normal activities or eating and drinking.  Also to call if any other questions or concerns.  

## 2022-10-31 ENCOUNTER — Encounter: Payer: Self-pay | Admitting: Internal Medicine

## 2023-11-16 NOTE — Progress Notes (Unsigned)
Cardiology Office Note    Date:  11/19/2023  ID:  Calvin Patterson, Calvin Patterson 24, 1952, MRN [id number] PCP:  Calvin Polka., MD  Cardiologist:  Calvin Nose, MD  Electrophysiologist:  None   Chief Complaint: Follow up for CAD.   History of Present Illness: .    Calvin Patterson is a 72 y.o. male with visit-pertinent history of CAD s/p NSTEMI in 11/2011, hyperlipidemia with statin intolerance, hypertension.   Presented as an NSTEMI in January 2013.  He underwent cardiac catheterization that showed high-grade circumflex lesion, he underwent Cutting Balloon atherectomy with marked improvement in his symptoms.  Patient has history of statin intolerance, he is also previously tried Zetia but did not reach his target LDL and he had localized reaction to Repatha.  His PCP started him on Nexlizet in 2023 with improvement.   Calvin Patterson was last seen in clinic by Dr. Rennis Patterson on 10/12/2022.  He had remained stable from a cardiac perspective.  Today he presents for follow-up.  He reports that he is doing very well, he has no concerns or complaints today.  He regularly goes fishing, recently went fishing in [place] and had to walk up a river, tolerated very well without any anginal symptoms.  He notes that he does have some plantar fasciitis in his right heel, has been doing stretches to help with this and is continuing to walk.  He denies any chest pain, shortness of breath, lower extremity edema, orthopnea or PND.  Labwork independently reviewed: 07/13/23: Hemoglobin 13.3, hematocrit 38.9, sodium 133, potassium 4.6, creatinine 1.0, AST 26, ALT 29 ROS: .   Today he denies chest pain, shortness of breath, lower extremity edema, fatigue, palpitations, melena, hematuria, hemoptysis, diaphoresis, weakness, presyncope, syncope, orthopnea, and PND.  All other systems are reviewed and otherwise negative. Studies Reviewed: Marland Kitchen    EKG:  EKG is ordered today, personally reviewed, demonstrating  EKG  Interpretation Date/Time:  Monday November 19 2023 11:47:28 EST Ventricular Rate:  61 PR Interval:  186 QRS Duration:  86 QT Interval:  376 QTC Calculation: 378 R Axis:   8  Text Interpretation: Normal sinus rhythm Normal ECG No significant change was found Confirmed by Calvin Patterson [phone]) on 11/19/2023 12:47:14 PM    Current Reported Medications:.    Current Meds  Medication Sig   aspirin 81 MG tablet Take 81 mg by mouth daily.   Coenzyme Q10 300 MG CAPS Take 1 capsule by mouth daily.   ezetimibe (ZETIA) 10 MG tablet Take 10 mg by mouth daily.   lisinopril (PRINIVIL,ZESTRIL) 10 MG tablet Take 10 mg by mouth daily.   Multiple Vitamins-Minerals (MULTIVITAMINS THER. W/MINERALS) TABS Take 1 tablet by mouth daily.     NEXLIZET 180-10 MG TABS Take 1 tablet by mouth daily.   rosuvastatin (CRESTOR) 5 MG tablet Take 5 mg by mouth daily. Takes every 3rd day   Physical Exam:    VS:  BP 120/78 (BP Location: Left Arm, Patient Position: Sitting, Cuff Size: Normal)   Pulse 61   Ht 5\' 9"  (1.753 m)   Wt 182 lb 3.2 oz (82.6 kg)   BMI 26.91 kg/m    Wt Readings from Last 3 Encounters:  11/19/23 182 lb 3.2 oz (82.6 kg)  10/27/22 179 lb (81.2 kg)  10/12/22 181 lb 6.4 oz (82.3 kg)    GEN: Well nourished, well developed in no acute distress NECK: No JVD; No carotid bruits CARDIAC: RRR, no murmurs, rubs, gallops RESPIRATORY:  Clear to  auscultation without rales, wheezing or rhonchi  ABDOMEN: Soft, non-tender, non-distended EXTREMITIES:  No edema; No acute deformity   Asessement and Plan:.   CAD: s/p NSTEMI in 11/2011, catheterization showed high-grade circumflex lesion, he underwent cutting balloon atherectomy. Stable with no anginal symptoms. No indication for ischemic evaluation.  Heart healthy diet and regular cardiovascular exercise encouraged.  Continue aspirin 81 mg daily, lisinopril 10 mg daily and Zetia 10 mg daily.   Hypertension: Blood pressure today 120/78. Continue aspirin 81 mg  daily, Zetia 10 mg daily, Lisinopril 10 mg daily.   Hyperlipidemia: Last lipid profile on 07/13/23 indicated total cholesterol 166, HDL 57, triglycerides 84 and LDL 92.  Patient unable to tolerate higher dose statins, Praluent or Nexlizet. He notes with Nexlizet he developed leg cramps. Continue rosuvastatin 5 mg every third day and Zetia 10 mg daily.    Disposition: F/u with Dr. Rennis Patterson in one year.   Signed, Rip Harbour, NP

## 2023-11-19 ENCOUNTER — Ambulatory Visit: Payer: Medicare HMO | Attending: Cardiology | Admitting: Cardiology

## 2023-11-19 ENCOUNTER — Encounter: Payer: Self-pay | Admitting: Cardiology

## 2023-11-19 VITALS — BP 120/78 | HR 61 | Ht 69.0 in | Wt 182.2 lb

## 2023-11-19 DIAGNOSIS — I2510 Atherosclerotic heart disease of native coronary artery without angina pectoris: Secondary | ICD-10-CM

## 2023-11-19 DIAGNOSIS — I10 Essential (primary) hypertension: Secondary | ICD-10-CM

## 2023-11-19 DIAGNOSIS — E785 Hyperlipidemia, unspecified: Secondary | ICD-10-CM | POA: Diagnosis not present

## 2023-11-19 NOTE — Patient Instructions (Signed)
 Medication Instructions:  No changes *If you need a refill on your cardiac medications before your next appointment, please call your pharmacy*   Lab Work: No labs If you have labs (blood work) drawn today and your tests are completely normal, you will receive your results only by: MyChart Message (if you have MyChart) OR A paper copy in the mail If you have any lab test that is abnormal or we need to change your treatment, we will call you to review the results.   Testing/Procedures: No testing   Follow-Up: At [facility], you and your health needs are our priority.  As part of our continuing mission to provide you with exceptional heart care, we have created designated Provider Care Teams.  These Care Teams include your primary Cardiologist (physician) and Advanced Practice Providers (APPs -  Physician Assistants and Nurse Practitioners) who all work together to provide you with the care you need, when you need it.  We recommend signing up for the patient portal called "MyChart".  Sign up information is provided on this After Visit Summary.  MyChart is used to connect with patients for Virtual Visits (Telemedicine).  Patients are able to view lab/test results, encounter notes, upcoming appointments, etc.  Non-urgent messages can be sent to your provider as well.   To learn more about what you can do with MyChart, go to [email or web address].    Your next appointment:   1 year(s)  Provider:   Chrystie Nose, MD

## 2024-11-16 NOTE — Progress Notes (Unsigned)
 "  Cardiology Office Note    Date:  11/18/2024  ID:  Ronie, Patterson May 03, 1951, MRN [id number] PCP:  Loreli Elsie Calvin Patterson., MD  Cardiologist:  Calvin JAYSON Maxcy, MD  Electrophysiologist:  None   Chief Complaint: Follow up for  History of Present Illness: .   Calvin Patterson is a 73 y.o. male with visit-pertinent history of CAD s/p NSTEMI in 11/2011, hyperlipidemia with statin intolerance, hypertension.    Presented as an NSTEMI in January 2013.  He underwent cardiac catheterization that showed high-grade circumflex lesion, he underwent Cutting Balloon atherectomy with marked improvement in his symptoms.  Patient has history of statin intolerance, he is also previously tried Zetia but did not reach his target LDL and he had localized reaction to Repatha .  His PCP started him on Nexlizet in 2023 with improvement.    Calvin Patterson was seen in clinic by Calvin Patterson on 10/12/2022.  He had remained stable from a cardiac perspective.  He was last seen in clinic by myself on 11/19/2023.  He denied any cardiac concerns or complaints.  He recently been fishing in [place]  and had to walk up river, tolerated very well.  Today he presents for follow-up.  He reports that he has been doing well overall.  He denies chest pain, shortness of breath, lower extremity edema, orthopnea or PND.  He denies any palpitations, recent presyncope or syncope.  Patient notes that during the summer months if he is outside bent over working for prolonged periods when he stands up he has some slight dizziness that quickly resolves, he notes this is related to his blood pressure medications.  He denies any true syncope.  Patient continues to walk multiple miles at least 5 days a week, tolerates well. ROS: .   Today he denies chest pain, shortness of breath, lower extremity edema, fatigue, palpitations, melena, hematuria, hemoptysis, diaphoresis, weakness, presyncope, syncope, orthopnea, and PND.  All other systems are reviewed and  otherwise negative. Studies Reviewed: SABRA   EKG:  EKG is ordered today, personally reviewed, demonstrating  EKG Interpretation Date/Time:  Tuesday November 18 2024 10:50:09 EST Ventricular Rate:  57 PR Interval:  188 QRS Duration:  84 QT Interval:  390 QTC Calculation: 379 R Axis:   29  Text Interpretation: Sinus bradycardia When compared with ECG of 19-Nov-2023 11:47, No significant change was found Confirmed by Calvin Patterson [phone]) on 11/18/2024 10:52:45 AM   CV Studies: Cardiac studies reviewed are outlined and summarized above. Otherwise please see EMR for full report.      Current Reported Medications:.    Active Medications[1]  Physical Exam:    VS:  BP 124/78   Pulse (!) 59   Ht 5' 9 (1.753 m)   Wt 176 lb 3.2 oz (79.9 kg)   SpO2 98%   BMI 26.02 kg/m    Wt Readings from Last 3 Encounters:  11/18/24 176 lb 3.2 oz (79.9 kg)  11/19/23 182 lb 3.2 oz (82.6 kg)  10/27/22 179 lb (81.2 kg)    GEN: Well nourished, well developed in no acute distress NECK: No JVD; No carotid bruits CARDIAC: RRR, no murmurs, rubs, gallops RESPIRATORY:  Clear to auscultation without rales, wheezing or rhonchi  ABDOMEN: Soft, non-tender, non-distended EXTREMITIES:  No edema; No acute deformity     Asessement and Plan:.    CAD: s/p NSTEMI in 11/2011, catheterization showed high-grade circumflex lesion, he underwent cutting balloon atherectomy. Stable with no anginal symptoms. No indication for ischemic evaluation.  Heart healthy diet and regular cardiovascular exercise encouraged.  Continue aspirin  81 mg daily, Zetia 10 mg daily, lisinopril  10 mg daily, Crestor  5 mg every 3 days.  Hypertension: Blood pressure today 124/78.  Continue lisinopril  10 mg daily.  Hyperlipidemia: Last lipid profile not available, will request from PCP. Patient unable to tolerate higher dose statins, Praluent  or Nexlizet. He notes with Nexlizet he developed leg cramps.  He reports that he is not interested in [place].   Continue rosuvastatin  5 mg every third day and Zetia 10 mg daily.    Disposition: F/u with Dr. Mona or Salisha Bardsley, NP in one year or sooner if needed.   Signed, Alyssandra Hulsebus D Alani Lacivita, NP       [1]  Current Meds  Medication Sig   aspirin  81 MG tablet Take 81 mg by mouth daily.   Coenzyme Q10 300 MG CAPS Take 1 capsule by mouth daily.   ezetimibe (ZETIA) 10 MG tablet Take 10 mg by mouth daily.   Multiple Vitamins-Minerals (MULTIVITAMINS THER. W/MINERALS) TABS Take 1 tablet by mouth daily.     rosuvastatin  (CRESTOR ) 5 MG tablet Take 5 mg by mouth daily. Takes every 3rd day   "

## 2024-11-18 ENCOUNTER — Ambulatory Visit: Attending: Cardiology | Admitting: Cardiology

## 2024-11-18 ENCOUNTER — Encounter: Payer: Self-pay | Admitting: Cardiology

## 2024-11-18 VITALS — BP 124/78 | HR 59 | Ht 69.0 in | Wt 176.2 lb

## 2024-11-18 DIAGNOSIS — I10 Essential (primary) hypertension: Secondary | ICD-10-CM

## 2024-11-18 DIAGNOSIS — I2510 Atherosclerotic heart disease of native coronary artery without angina pectoris: Secondary | ICD-10-CM

## 2024-11-18 DIAGNOSIS — E785 Hyperlipidemia, unspecified: Secondary | ICD-10-CM

## 2024-11-18 NOTE — Patient Instructions (Signed)
" °  Follow-Up: At [facility], you and your health needs are our priority.  As part of our continuing mission to provide you with exceptional heart care, our providers are all part of one team.  This team includes your primary Cardiologist (physician) and Advanced Practice Providers or APPs (Physician Assistants and Nurse Practitioners) who all work together to provide you with the care you need, when you need it.  Your next appointment:   1 year(s)  Provider:   Vinie JAYSON Maxcy, MD or Katlyn West, NP We recommend signing up for the patient portal called MyChart.  Sign up information is provided on this After Visit Summary.  MyChart is used to connect with patients for Virtual Visits (Telemedicine).  Patients are able to view lab/test results, encounter notes, upcoming appointments, etc.  Non-urgent messages can be sent to your provider as well.   To learn more about what you can do with MyChart, go to [email or web address].          "
